# Patient Record
Sex: Male | Born: 1960 | Race: White | Hispanic: No | Marital: Single | Smoking: Never smoker
Health system: Southern US, Community
[De-identification: ages and names within clinical notes are randomized; demographics above are authoritative.]

## PROBLEM LIST (undated history)

## (undated) DIAGNOSIS — K219 Gastro-esophageal reflux disease without esophagitis: Secondary | ICD-10-CM

## (undated) DIAGNOSIS — F32A Depression, unspecified: Secondary | ICD-10-CM

## (undated) DIAGNOSIS — E559 Vitamin D deficiency, unspecified: Secondary | ICD-10-CM

## (undated) DIAGNOSIS — I10 Essential (primary) hypertension: Secondary | ICD-10-CM

## (undated) DIAGNOSIS — H269 Unspecified cataract: Secondary | ICD-10-CM

## (undated) DIAGNOSIS — M1990 Unspecified osteoarthritis, unspecified site: Secondary | ICD-10-CM

## (undated) DIAGNOSIS — F319 Bipolar disorder, unspecified: Secondary | ICD-10-CM

## (undated) DIAGNOSIS — E663 Overweight: Secondary | ICD-10-CM

## (undated) DIAGNOSIS — E039 Hypothyroidism, unspecified: Secondary | ICD-10-CM

## (undated) DIAGNOSIS — T7840XA Allergy, unspecified, initial encounter: Secondary | ICD-10-CM

## (undated) DIAGNOSIS — F329 Major depressive disorder, single episode, unspecified: Secondary | ICD-10-CM

## (undated) DIAGNOSIS — E119 Type 2 diabetes mellitus without complications: Secondary | ICD-10-CM

## (undated) DIAGNOSIS — E785 Hyperlipidemia, unspecified: Secondary | ICD-10-CM

## (undated) HISTORY — DX: Vitamin D deficiency, unspecified: E55.9

## (undated) HISTORY — DX: Bipolar disorder, unspecified: F31.9

## (undated) HISTORY — DX: Gastro-esophageal reflux disease without esophagitis: K21.9

## (undated) HISTORY — DX: Depression, unspecified: F32.A

## (undated) HISTORY — DX: Overweight: E66.3

## (undated) HISTORY — DX: Essential (primary) hypertension: I10

## (undated) HISTORY — DX: Unspecified osteoarthritis, unspecified site: M19.90

## (undated) HISTORY — DX: Type 2 diabetes mellitus without complications: E11.9

## (undated) HISTORY — DX: Hypothyroidism, unspecified: E03.9

## (undated) HISTORY — DX: Unspecified cataract: H26.9

## (undated) HISTORY — DX: Hyperlipidemia, unspecified: E78.5

## (undated) HISTORY — PX: COLONOSCOPY: SHX174

## (undated) HISTORY — DX: Allergy, unspecified, initial encounter: T78.40XA

## (undated) HISTORY — PX: POLYPECTOMY: SHX149

## (undated) HISTORY — PX: APPENDECTOMY: SHX54

---
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
## 2000-03-12 LAB — PULMONARY FUNCTION TEST

## 2001-10-25 ENCOUNTER — Emergency Department (HOSPITAL_COMMUNITY): Admission: EM | Admit: 2001-10-25 | Discharge: 2001-10-25 | Payer: Self-pay | Admitting: *Deleted

## 2004-08-10 ENCOUNTER — Ambulatory Visit: Payer: Self-pay | Admitting: Family Medicine

## 2004-10-15 ENCOUNTER — Ambulatory Visit: Payer: Self-pay | Admitting: *Deleted

## 2004-11-29 ENCOUNTER — Ambulatory Visit: Payer: Self-pay | Admitting: Family Medicine

## 2005-07-26 ENCOUNTER — Ambulatory Visit: Payer: Self-pay | Admitting: Family Medicine

## 2005-07-26 DIAGNOSIS — J309 Allergic rhinitis, unspecified: Secondary | ICD-10-CM | POA: Insufficient documentation

## 2006-10-20 ENCOUNTER — Telehealth (INDEPENDENT_AMBULATORY_CARE_PROVIDER_SITE_OTHER): Payer: Self-pay | Admitting: *Deleted

## 2006-10-22 ENCOUNTER — Telehealth (INDEPENDENT_AMBULATORY_CARE_PROVIDER_SITE_OTHER): Payer: Self-pay | Admitting: Family Medicine

## 2006-10-24 DIAGNOSIS — E039 Hypothyroidism, unspecified: Secondary | ICD-10-CM

## 2006-10-24 DIAGNOSIS — J45909 Unspecified asthma, uncomplicated: Secondary | ICD-10-CM | POA: Insufficient documentation

## 2006-10-27 ENCOUNTER — Ambulatory Visit: Payer: Self-pay | Admitting: Family Medicine

## 2006-10-29 ENCOUNTER — Encounter (INDEPENDENT_AMBULATORY_CARE_PROVIDER_SITE_OTHER): Payer: Self-pay | Admitting: *Deleted

## 2006-10-31 LAB — CONVERTED CEMR LAB
BUN: 19 mg/dL (ref 6–23)
Basophils Relative: 1 % (ref 0–1)
CO2: 25 meq/L (ref 19–32)
Calcium: 9.6 mg/dL (ref 8.4–10.5)
Chloride: 104 meq/L (ref 96–112)
Creatinine, Ser: 1.1 mg/dL (ref 0.40–1.50)
Hemoglobin: 14.8 g/dL (ref 13.0–17.0)
Lithium Lvl: 0.82 meq/L (ref 0.80–1.40)
Lymphocytes Relative: 24 % (ref 12–46)
MCHC: 33.9 g/dL (ref 30.0–36.0)
Monocytes Absolute: 0.6 10*3/uL (ref 0.2–0.7)
Monocytes Relative: 5 % (ref 3–11)
Neutro Abs: 7 10*3/uL (ref 1.7–7.7)
PSA: 0.6 ng/mL (ref 0.10–4.00)
RBC: 4.95 M/uL (ref 4.22–5.81)
TSH: 11.095 microintl units/mL — ABNORMAL HIGH (ref 0.350–5.50)

## 2007-01-28 ENCOUNTER — Telehealth (INDEPENDENT_AMBULATORY_CARE_PROVIDER_SITE_OTHER): Payer: Self-pay | Admitting: Family Medicine

## 2007-02-16 ENCOUNTER — Ambulatory Visit: Payer: Self-pay | Admitting: Family Medicine

## 2007-02-19 LAB — CONVERTED CEMR LAB: TSH: 11.015 microintl units/mL — ABNORMAL HIGH (ref 0.350–5.50)

## 2007-03-11 ENCOUNTER — Telehealth (INDEPENDENT_AMBULATORY_CARE_PROVIDER_SITE_OTHER): Payer: Self-pay | Admitting: Internal Medicine

## 2007-04-01 ENCOUNTER — Ambulatory Visit: Payer: Self-pay | Admitting: Internal Medicine

## 2008-06-21 ENCOUNTER — Telehealth (INDEPENDENT_AMBULATORY_CARE_PROVIDER_SITE_OTHER): Payer: Self-pay | Admitting: Family Medicine

## 2008-07-27 ENCOUNTER — Ambulatory Visit: Payer: Self-pay | Admitting: Family Medicine

## 2008-07-27 ENCOUNTER — Encounter (INDEPENDENT_AMBULATORY_CARE_PROVIDER_SITE_OTHER): Payer: Self-pay | Admitting: Nurse Practitioner

## 2010-01-12 ENCOUNTER — Emergency Department (HOSPITAL_COMMUNITY)
Admission: EM | Admit: 2010-01-12 | Discharge: 2010-01-12 | Payer: Self-pay | Source: Home / Self Care | Admitting: Emergency Medicine

## 2010-01-12 ENCOUNTER — Emergency Department (HOSPITAL_COMMUNITY)
Admission: EM | Admit: 2010-01-12 | Discharge: 2010-01-12 | Disposition: A | Discharge status: Other Acute Inpatient Hospital | Payer: Self-pay | Source: Home / Self Care | Admitting: Family Medicine

## 2010-05-29 ENCOUNTER — Encounter: Payer: Self-pay | Admitting: Pulmonary Disease

## 2010-05-31 ENCOUNTER — Ambulatory Visit (INDEPENDENT_AMBULATORY_CARE_PROVIDER_SITE_OTHER): Payer: Medicare Other | Admitting: Pulmonary Disease

## 2010-05-31 ENCOUNTER — Encounter: Payer: Self-pay | Admitting: Pulmonary Disease

## 2010-05-31 VITALS — BP 132/90 | HR 88 | Temp 98.3°F | Ht 72.0 in | Wt 239.8 lb

## 2010-05-31 DIAGNOSIS — E559 Vitamin D deficiency, unspecified: Secondary | ICD-10-CM

## 2010-05-31 DIAGNOSIS — J45909 Unspecified asthma, uncomplicated: Secondary | ICD-10-CM

## 2010-05-31 DIAGNOSIS — E663 Overweight: Secondary | ICD-10-CM

## 2010-05-31 DIAGNOSIS — F419 Anxiety disorder, unspecified: Secondary | ICD-10-CM

## 2010-05-31 DIAGNOSIS — E7800 Pure hypercholesterolemia, unspecified: Secondary | ICD-10-CM

## 2010-05-31 DIAGNOSIS — E039 Hypothyroidism, unspecified: Secondary | ICD-10-CM

## 2010-05-31 DIAGNOSIS — I10 Essential (primary) hypertension: Secondary | ICD-10-CM

## 2010-05-31 DIAGNOSIS — F319 Bipolar disorder, unspecified: Secondary | ICD-10-CM

## 2010-05-31 DIAGNOSIS — M1990 Unspecified osteoarthritis, unspecified site: Secondary | ICD-10-CM

## 2010-05-31 DIAGNOSIS — F411 Generalized anxiety disorder: Secondary | ICD-10-CM

## 2010-05-31 MED ORDER — ERGOCALCIFEROL 1.25 MG (50000 UT) PO CAPS: 50000.0000 [IU] | ORAL_CAPSULE | ORAL | Status: DC

## 2010-05-31 MED ORDER — LISINOPRIL-HYDROCHLOROTHIAZIDE 10-12.5 MG PO TABS: 1.0000 | ORAL_TABLET | Freq: Every day | ORAL | Status: DC

## 2010-05-31 MED ORDER — SIMVASTATIN 40 MG PO TABS: 40.0000 mg | ORAL_TABLET | Freq: Every day | ORAL | Status: DC

## 2010-05-31 MED ORDER — MOMETASONE FUROATE 220 MCG/INH IN AEPB: 2.0000 | INHALATION_SPRAY | Freq: Every day | RESPIRATORY_TRACT | Status: DC

## 2010-05-31 MED ORDER — LEVOTHYROXINE SODIUM 200 MCG PO TABS: 200.0000 ug | ORAL_TABLET | Freq: Every day | ORAL | Status: DC

## 2010-05-31 MED ORDER — ALBUTEROL SULFATE HFA 108 (90 BASE) MCG/ACT IN AERS: 2.0000 | INHALATION_SPRAY | Freq: Four times a day (QID) | RESPIRATORY_TRACT | Status: DC | PRN

## 2010-05-31 NOTE — Patient Instructions (Signed)
Mark Booth, it was nice meeting you today...    We entered your medications into our system & wrote new prescriptions as we discussed...  For your Asthma:  Take the Asmanex one inhalation daily as a controller med, and use the Proair 1-2sp every 4-6H as needed (rescue inhaler); when you return we will inquire about how your control has been on this regimen & go from there...  Continue the BP med & Simvastatin for your Cholesterol... Continue the current dose of the Synthroid for now...  Please return to our lab one morning next week for your fasting blood work...    Then call the PHONE TREE in a few days for your results...    Dial [id number] & when prompted enter your patient number followed by the # symbol...    Your patient number is:  [id number]#  Call for any questions... Let's plan a follow up appt in one month.Marland KitchenMarland Kitchen

## 2010-06-02 ENCOUNTER — Encounter: Payer: Self-pay | Admitting: Pulmonary Disease

## 2010-06-02 DIAGNOSIS — E559 Vitamin D deficiency, unspecified: Secondary | ICD-10-CM | POA: Insufficient documentation

## 2010-06-02 DIAGNOSIS — F319 Bipolar disorder, unspecified: Secondary | ICD-10-CM | POA: Insufficient documentation

## 2010-06-02 DIAGNOSIS — M1990 Unspecified osteoarthritis, unspecified site: Secondary | ICD-10-CM | POA: Insufficient documentation

## 2010-06-02 DIAGNOSIS — E669 Obesity, unspecified: Secondary | ICD-10-CM | POA: Insufficient documentation

## 2010-06-02 NOTE — Progress Notes (Signed)
Subjective:    Patient ID: Mark Booth, male    DOB: December 07, 1960, 50 y.o.   MRN: [id number]  HPI 49 y/o WM, son of Jakell Trusty, here to establish medical care> he is disabled due to Bipolar disease & unemployed;  He was prev followed by [facility] & their EMR records are reviewed;  Most recently followed by DrDewey at the [facility] ([facility] of [facility]) & we have requested these records as well...  PROBLEM LIST:  Allergic Rhinitis>  He AR & allergies w/ mod symptoms in spring & fall that he treats w/ OTC meds prn;  Prev allergy testing pos for grasses & "lots of things" he says;  He was on shots for several yrs & had hx elev eos he thinks...  Asthma>  He states hx Asthma x yrs w/ symptoms primarily during exercise- chest tightness, wheezing, cough, SOB, etc;  Treated w/ Advair- switched to ASMANEX 220- 1 inhalation daily & PROAIR as rescue inhaler (hasn't needed in many months, he says)... ~  ?prev CXR, ?prev PFTs> we will wait for old records ~  12/11:  CXR from [facility] visit for foreing body in esoph (?chicken bone)> mild perihilar bronchitic change, norm heart size, NAD... ~  4/12:  Pt w/o recent asthma exac> no wheezing, no cough, no dyspnea reported;  Exam- clear...  HBP>  On LISINOPRIL/HCT 10-12.5 daily w/ fair control of BP;  Asked to monitor BP at home if poss, & we discussed low sodium/ wt reducing diet... ~  4/12:  BP= 132/90 & he denies CP, palpit, dizzy, syncope, edema, etc...  Hyperlipidemia>  On SIMVASTATIN 40mg /d which he reports was started by [facility] ~[id number];  We discussed low chol/ low fat/ wt reducing diet... ~  4/12:  Pt will ret for FLP>   Hypothyroidism>  On SYNTHROID 218mcg/d (on this dose for about 38yr he says);  Long hx hypothy dating back to his teens... ~  6/10:  Avail records from [facility] showed Rx w/ Levothy 122mcg/d (refilled) & TSH= 10.75, ?compliance, prev= 3.62)...  Osteoarthritis>  On OTC anti-inflamm meds prn;  C/o left knee pain off & on "it's  my MCL" but he has apparently not had an Ortho eval... ~  Hx LBP w/ herniated disc in 2000, he says;  He notes abn MRI in [place], but no surg required (he stopped running & symptoms improved)...  Vit D Defic>  On Vit D 50,000 u weekly since Jan2012, he says;  This prob was discovered by Psychiatrist & confirmed by his LMD...  Biploar Disorder>  On LITHIUM 300mg - 4 daily, and LAMICTAL 200mg  daily; followed by Dr. Cherre Robins for Psychiatry...   Past Medical History  Diagnosis Date  . Allergic rhinitis   . Bipolar disorder   . Hypothyroidism   . Asthma   . Hypertension   . Hyperlipidemia   . Overweight   . Osteoarthritis   . Unspecified vitamin D deficiency     Past Surgical History  Procedure Date  . Appendectomy     Outpatient Encounter Prescriptions as of 05/31/2010  Medication Sig Dispense Refill  . albuterol (PROAIR HFA) 108 (90 BASE) MCG/ACT inhaler Inhale 2 puffs into the lungs every 6 (six) hours as needed.  1 Inhaler  6  . ergocalciferol (VITAMIN D2) 50000 UNITS capsule Take 1 capsule (50,000 Units total) by mouth once a week.  4 capsule  6  . lamoTRIgine (LAMICTAL) 100 MG tablet 2 tablets once a day       . levothyroxine (  SYNTHROID, LEVOTHROID) 200 MCG tablet Take 1 tablet (200 mcg total) by mouth daily.  30 tablet  6  . lithium 300 MG capsule Take 1200mg  daily      . mometasone (ASMANEX 120 METERED DOSES) 220 MCG/INH inhaler Inhale 2 puffs into the lungs daily.  1 Inhaler  6  . simvastatin (ZOCOR) 40 MG tablet Take 1 tablet (40 mg total) by mouth at bedtime.  30 tablet  6  . Fluticasone-Salmeterol (ADVAIR DISKUS) 250-50 MCG/DOSE AEPB Inhale 1 puff into the lungs every 12 (twelve) hours.        Marland Kitchen lisinopril-hydrochlorothiazide (PRINZIDE,ZESTORETIC) 10-12.5 MG per tablet Take 1 tablet by mouth daily.  30 tablet  6  . sertraline (ZOLOFT) 50 MG tablet Take 50 mg by mouth every morning.          No Known Allergies   Family History  Problem Relation Age of Onset  .  Osteoporosis Mother   . Depression Mother   . Diabetes Mother   . Hypothyroidism Mother   . Prostate cancer Father     History   Social History  . Marital Status: Single    Spouse Name: N/A    Number of Children: N/A  . Years of Education: N/A   Occupational History  . Unemployed    Social History Main Topics  . Smoking status: Never Smoker   . Smokeless tobacco: Never Used  . Alcohol Use: No     stopped using etoh in 2006  . Drug Use: No  . Sexually Active: Not on file   Other Topics Concern  . Not on file   Social History Narrative  . No narrative on file    Review of Systems    Constitutional:  Denies F/C/S, anorexia, unexpected weight change. HEENT:  No HA, visual changes, earache, nasal symptoms, sore throat, hoarseness. Resp:  No cough, sputum, hemoptysis; no SOB, tightness, wheezing. Cardio:  No CP, palpit, DOE, orthopnea, edema. GI:  Denies N/V/D/C or blood in stool; no reflux, abd pain, distention, or gas. GU:  No dysuria, freq, urgency, hematuria, or flank pain. MS:  Denies joint pain, swelling, tenderness, or decr ROM; no neck pain, back pain, etc. Neuro:  No tremors, seizures, dizziness, syncope, weakness, numbness, gait abn. Skin:  No suspicious lesions or skin rash. Heme:  No adenopathy, bruising, bleeding. Psyche: Hx Bipolar dis; denies confusion, sleep disturbance, hallucinations.   Objective:   Physical Exam    WD, Overweight, 50 y/o WM in NAD... Vital Signs:  Reviewed...  General:  Alert & oriented; pleasant & cooperative... HEENT:  [place]/AT, EOM-wnl, PERRLA, Fundi-benign, EACs-clear, TMs-wnl, NOSE-clear, THROAT-clear & wnl. Neck:  Supple w/ full ROM; no JVD; normal carotid impulses w/o bruits; no thyromegaly or nodules palpated; no lymphadenopathy. Chest:  Clear to P & A; without wheezes/ rales/ or rhonchi heard;  Pulses intact w/o bruits... Heart:  Regular Rhythm; norm S1 & S2 without murmurs/ rubs/ or gallops detected... Abdomen:  Soft &  nontender; normal bowel sounds; no organomegaly or masses palpated... Ext:  Normal ROM; without deformities or arthritic changes; no varicose veins, venous insuffic, or edema... Neuro:  CNs II-XII intact; motor testing normal; sensory testing normal; gait normal & balance OK... Derm:   Lipoma noted on left shoulder;  no rash etc... Lymph:  No cervical, supraclavicular, axillary, or inguinal adenopathy palpated...    Assessment & Plan:   NEW PATIENT EVAL>  We have reviewed avail records in [facility];  We have requested records from his  most recent LMD- DrDewey at the [facility]...  AR & Asthma>  Stable on his most recent Rx w/ ASMANEX 220- 1inhalation daily, & Proair prn;  We will await records & review any prev PFTs- sounds clear now & last CXR 12/11 was neg;  Continue current meds for now & watch for exercise induced symptoms & monitor use of rescue inhaler...  HBP>  Control looks fair in the Prinizide, asked to elim sodium/ get wt down/ continue same med for now...  CHOL>  We discussed low chol/ low fat/ wt reducing diet;  Ret for FLP...  Overweight>  We reviewed diet & exercise program needed to lose wt...  Hypothyroid>  ?why requiring 242mcg/d dose?  prob compliance related in my opinion & we will recheck labs & monitor med use, asked to bring bottles to each visit...  Vit D Defic>  Awaiting records from [facility];  Continue 50K per week for now & we will f/u labs later...  Bipolar dis>  He will maintain f/u w/ DrDuskin, Psychiatry & try to get records to [place]...  Lipoma>  Mod sized lipoma left shoulder & we can refer to [facility] when he is ready.Marland KitchenMarland Kitchen

## 2010-06-06 ENCOUNTER — Other Ambulatory Visit (INDEPENDENT_AMBULATORY_CARE_PROVIDER_SITE_OTHER): Payer: Medicare Other

## 2010-06-06 ENCOUNTER — Other Ambulatory Visit (INDEPENDENT_AMBULATORY_CARE_PROVIDER_SITE_OTHER): Payer: Medicare Other | Admitting: Pulmonary Disease

## 2010-06-06 DIAGNOSIS — F419 Anxiety disorder, unspecified: Secondary | ICD-10-CM

## 2010-06-06 DIAGNOSIS — E7800 Pure hypercholesterolemia, unspecified: Secondary | ICD-10-CM

## 2010-06-06 DIAGNOSIS — I10 Essential (primary) hypertension: Secondary | ICD-10-CM

## 2010-06-06 DIAGNOSIS — E785 Hyperlipidemia, unspecified: Secondary | ICD-10-CM

## 2010-06-06 DIAGNOSIS — E039 Hypothyroidism, unspecified: Secondary | ICD-10-CM

## 2010-06-06 DIAGNOSIS — F411 Generalized anxiety disorder: Secondary | ICD-10-CM

## 2010-06-06 LAB — CBC WITH DIFFERENTIAL/PLATELET
Basophils Absolute: 0.1 10*3/uL (ref 0.0–0.1)
Eosinophils Absolute: 0.7 10*3/uL (ref 0.0–0.7)
Hemoglobin: 14.8 g/dL (ref 13.0–17.0)
Lymphocytes Relative: 24.6 % (ref 12.0–46.0)
MCHC: 34.7 g/dL (ref 30.0–36.0)
MCV: 90.4 fl (ref 78.0–100.0)
Monocytes Absolute: 0.6 10*3/uL (ref 0.1–1.0)
Neutro Abs: 7.7 10*3/uL (ref 1.4–7.7)
RDW: 12.8 % (ref 11.5–14.6)

## 2010-06-06 LAB — HEPATIC FUNCTION PANEL
ALT: 28 U/L (ref 0–53)
Alkaline Phosphatase: 71 U/L (ref 39–117)
Bilirubin, Direct: 0.1 mg/dL (ref 0.0–0.3)
Total Protein: 7.2 g/dL (ref 6.0–8.3)

## 2010-06-06 LAB — BASIC METABOLIC PANEL
CO2: 28 mEq/L (ref 19–32)
Calcium: 9.5 mg/dL (ref 8.4–10.5)
Chloride: 104 mEq/L (ref 96–112)
Sodium: 140 mEq/L (ref 135–145)

## 2010-06-06 LAB — LIPID PANEL: Total CHOL/HDL Ratio: 5

## 2010-06-06 LAB — T4, FREE: Free T4: 0.79 ng/dL (ref 0.60–1.60)

## 2010-06-13 ENCOUNTER — Telehealth: Payer: Self-pay | Admitting: Pulmonary Disease

## 2010-06-13 MED ORDER — FENOFIBRATE 160 MG PO TABS: 160.0000 mg | ORAL_TABLET | Freq: Every day | ORAL | Status: DC

## 2010-06-13 NOTE — Telephone Encounter (Signed)
Called and spoke with pt and he stated that he did listen to the phone tree message and is aware to start on the fenofibrate 160mg  daily.  This has been sent to pts pharmacy and pt is aware.  He will cont to do better diet--and is to call for any problems or concerns.

## 2010-06-13 NOTE — Telephone Encounter (Signed)
Per Leigh forward to her.

## 2010-06-27 ENCOUNTER — Encounter: Payer: Self-pay | Admitting: Pulmonary Disease

## 2010-07-02 ENCOUNTER — Telehealth: Payer: Self-pay | Admitting: Pulmonary Disease

## 2010-07-02 NOTE — Telephone Encounter (Signed)
Forwarded to Dr. Nadel for review. °

## 2010-07-04 ENCOUNTER — Ambulatory Visit: Payer: Medicare Other | Admitting: Pulmonary Disease

## 2010-07-06 ENCOUNTER — Telehealth: Payer: Self-pay | Admitting: Pulmonary Disease

## 2010-07-06 NOTE — Telephone Encounter (Signed)
Forwarded to Dr. Nadel for review. °

## 2010-07-23 ENCOUNTER — Encounter: Payer: Self-pay | Admitting: Pulmonary Disease

## 2010-07-25 ENCOUNTER — Encounter: Payer: Self-pay | Admitting: Pulmonary Disease

## 2010-07-25 ENCOUNTER — Ambulatory Visit (INDEPENDENT_AMBULATORY_CARE_PROVIDER_SITE_OTHER): Payer: Medicare Other | Admitting: Pulmonary Disease

## 2010-07-25 DIAGNOSIS — F419 Anxiety disorder, unspecified: Secondary | ICD-10-CM

## 2010-07-25 DIAGNOSIS — I10 Essential (primary) hypertension: Secondary | ICD-10-CM

## 2010-07-25 DIAGNOSIS — E039 Hypothyroidism, unspecified: Secondary | ICD-10-CM

## 2010-07-25 DIAGNOSIS — M1990 Unspecified osteoarthritis, unspecified site: Secondary | ICD-10-CM

## 2010-07-25 DIAGNOSIS — E559 Vitamin D deficiency, unspecified: Secondary | ICD-10-CM

## 2010-07-25 DIAGNOSIS — F319 Bipolar disorder, unspecified: Secondary | ICD-10-CM

## 2010-07-25 DIAGNOSIS — E1169 Type 2 diabetes mellitus with other specified complication: Secondary | ICD-10-CM | POA: Insufficient documentation

## 2010-07-25 DIAGNOSIS — J309 Allergic rhinitis, unspecified: Secondary | ICD-10-CM

## 2010-07-25 DIAGNOSIS — E782 Mixed hyperlipidemia: Secondary | ICD-10-CM

## 2010-07-25 DIAGNOSIS — J45909 Unspecified asthma, uncomplicated: Secondary | ICD-10-CM

## 2010-07-25 NOTE — Patient Instructions (Signed)
Today we updated your med list in our EPIC system...    Continue your present meds the same...  Let's get on the low chol/ LOW FAT/ diet & work on weight reduction...  Call for any problems...  Let's plan a recheck in 3-4 months w/ FASTING blood work at that time.Marland KitchenMarland Kitchen

## 2010-07-25 NOTE — Progress Notes (Signed)
Subjective:    Patient ID: Mark Booth, male    DOB: 08/03/1960, 50 y.o.   MRN: [id number]  HPI 59 y/o WM, son of Keyshaun Exley, here to establish medical care> he is disabled due to Bipolar disease & unemployed;  He was prev followed by [facility] & their EMR records are reviewed;  Most recently followed by DrDewey at the [facility] (part of [facility]) & we have requested these records as well ==> reviewed, see below...  PROBLEM LIST:  Allergic Rhinitis>  He has AR & allergies w/ mod symptoms in spring & fall that he treats w/ OTC meds prn;  Prev allergy testing pos for grasses & "lots of things" he says;  He was on shots for several yrs & had hx elev eos he thinks...  Asthma>  He states hx Asthma x yrs w/ symptoms primarily during exercise- chest tightness, wheezing, cough, SOB, etc;  Treated w/ Advair- switched to ASMANEX 220- 1 inhalation daily & PROAIR as rescue inhaler (hasn't needed in many months, he says)... ~  ?prev CXR (none sent from prev MD), ?prev PFTs (1/12 showed FEV1=3.38 [76%] & %1sec=76) ~  12/11:  CXR from [facility] visit for foreing body in esoph (?chicken bone)> mild perihilar bronchitic change, norm heart size, NAD... ~  4/12 & 6/12:  Pt w/o recent asthma exac> no wheezing, no cough, no dyspnea reported;  Exam- clear...  HBP>  On LISINOPRIL/HCT 10-12.5 daily w/ fair control of BP;  Asked to monitor BP at home if poss, & we discussed low sodium/ wt reducing diet... ~  4/12:  BP= 132/90 & he denies CP, palpit, dizzy, syncope, edema, etc... ~  6/12:  BP= 140/90 & reminded to limit sodium, incr exercise & lose wt...  Hyperlipidemia>  On SIMVASTATIN 40mg /d which he reports was started by [facility] ~[id number];  We discussed low chol/ low fat/ wt reducing diet... ~  1/12:  Records from [facility] showed TChol 252 (prev=317), TG 548, HDL 37, LDL --- & he started SIMVASTATIN 40mg /d + low fat diet. ~  4/12:  FLP on Simva40 showed TChol 217, TG 417, HDL 41, LDL 118... rec low fat  diet & start FENOFIBRATE 160mg /d...  Hypothyroidism>  On SYNTHROID 277mcg/d (on this dose for about 22yr he says);  Long hx hypothy dating back to his teens... ~  6/10:  Avail records from [facility] showed Rx w/ Levothy 113mcg/d (refilled) & TSH= 10.75, ?compliance, prev= 3.62)... ~  1/12:  Labs from DrDewey's office showed TSH= 2.89 on Levothy200 daily. ~  Labs here 4/12 on Levothy200 showed TSH= 3.84, FreeT3= 2.6 (2.3-4.2), FreeT4= 0.79 (0.60-1.60)... Continue same dose.  Osteoarthritis>  On OTC anti-inflamm meds prn;  C/o left knee pain off & on "it's my MCL" but he has apparently not had an Ortho eval... ~  Hx LBP w/ herniated disc in 2000, he says;  He notes abn MRI in [place], but no surg required (he stopped running & symptoms improved)...  Vit D Defic>  On Vit D 50,000 u weekly since Jan2012, he says;  This prob was discovered by Psychiatrist & confirmed by his LMD... ~  Labs 1/12 from [facility] showed Vit D = 11... Pt to continue 50K Vit D weekly...  Biploar Disorder>  On LITHIUM 300mg - 3 daily, and LAMICTAL 200mg  daily... Prev on Zoloft & wellbutrin as well (off now). ~  4/12:  followed by Dr. Cherre Robins for Psychiatry at the [facility] center... ~  6/12:  Pt informs me that DrDuskin  has left the center (now contracted to a pvt company) & he sees diff doctors.   Past Medical History  Diagnosis Date  . Allergic rhinitis   . Bipolar disorder   . Hypothyroidism   . Asthma   . Hypertension   . Hyperlipidemia   . Overweight   . Osteoarthritis   . Unspecified vitamin D deficiency     Past Surgical History  Procedure Date  . Appendectomy     Outpatient Encounter Prescriptions as of 07/25/2010  Medication Sig Dispense Refill  . albuterol (PROAIR HFA) 108 (90 BASE) MCG/ACT inhaler Inhale 2 puffs into the lungs every 6 (six) hours as needed.  1 Inhaler  6  . ergocalciferol (VITAMIN D2) 50000 UNITS capsule Take 1 capsule (50,000 Units total) by mouth once a week.  4  capsule  6  . fenofibrate 160 MG tablet Take 1 tablet (160 mg total) by mouth daily.  90 tablet  3  . lamoTRIgine (LAMICTAL) 100 MG tablet 2 tablets once a day       . levothyroxine (SYNTHROID, LEVOTHROID) 200 MCG tablet Take 1 tablet (200 mcg total) by mouth daily.  30 tablet  6  . lithium 300 MG capsule Take 3 tabs = 900mg  daily      . simvastatin (ZOCOR) 40 MG tablet Take 1 tablet (40 mg total) by mouth at bedtime.  30 tablet  6  . lisinopril-hydrochlorothiazide (PRINZIDE,ZESTORETIC) 10-12.5 MG per tablet Take 1 tablet by mouth daily.  30 tablet  6  . mometasone (ASMANEX 120 METERED DOSES) 220 MCG/INH inhaler Inhale 2 puffs into the lungs daily.  1 Inhaler  6    No Known Allergies   Family History  Problem Relation Age of Onset  . Osteoporosis Mother   . Depression Mother   . Diabetes Mother   . Hypothyroidism Mother   . Prostate cancer Father     History   Social History  . Marital Status: Single    Spouse Name: N/A    Number of Children: N/A  . Years of Education: N/A   Occupational History  . Unemployed    Social History Main Topics  . Smoking status: Never Smoker   . Smokeless tobacco: Never Used  . Alcohol Use: No     stopped using etoh in 2006  . Drug Use: No  . Sexually Active: Not on file   Other Topics Concern  . Not on file   Social History Narrative  . No narrative on file    Review of Systems    Constitutional:  Denies F/C/S, anorexia, unexpected weight change. HEENT:  No HA, visual changes, earache, nasal symptoms, sore throat, hoarseness. Resp:  No cough, sputum, hemoptysis; no SOB, tightness, wheezing. Cardio:  No CP, palpit, DOE, orthopnea, edema. GI:  Denies N/V/D/C or blood in stool; no reflux, abd pain, distention, or gas. GU:  No dysuria, freq, urgency, hematuria, or flank pain. MS:  Denies joint pain, swelling, tenderness, or decr ROM; no neck pain, back pain, etc. Neuro:  No tremors, seizures, dizziness, syncope, weakness, numbness,  gait abn. Skin:  No suspicious lesions or skin rash. Heme:  No adenopathy, bruising, bleeding. Psyche: Hx Bipolar dis; denies confusion, sleep disturbance, hallucinations.   Objective:   Physical Exam    WD, Overweight, 50 y/o WM in NAD... Vital Signs:  Reviewed...  General:  Alert & oriented; pleasant & cooperative... HEENT:  [place]/AT, EOM-wnl, PERRLA, Fundi-benign, EACs-clear, TMs-wnl, NOSE-clear, THROAT-clear & wnl. Neck:  Supple w/ full ROM;  no JVD; normal carotid impulses w/o bruits; no thyromegaly or nodules palpated; no lymphadenopathy. Chest:  Clear to P & A; without wheezes/ rales/ or rhonchi heard;  Pulses intact w/o bruits... Heart:  Regular Rhythm; norm S1 & S2 without murmurs/ rubs/ or gallops detected... Abdomen:  Soft & nontender; normal bowel sounds; no organomegaly or masses palpated... Ext:  Normal ROM; without deformities or arthritic changes; no varicose veins, venous insuffic, or edema... Neuro:  CNs II-XII intact; motor testing normal; sensory testing normal; gait normal & balance OK... Derm:   Lipoma noted on left shoulder;  no rash etc... Lymph:  No cervical, supraclavicular, axillary, or inguinal adenopathy palpated...    Assessment & Plan:   AR & Asthma>  Stable on his most recent Rx w/ ASMANEX 220- 1inhalation daily, & Proair prn;  records reviewed> PFTs w/ mild sm airways dis & ?superimposed restriction... sounds clear now & last CXR 12/11 was neg;  Continue current meds for now & watch for exercise induced symptoms & monitor use of rescue inhaler...  HBP>  Control looks fair in the Prinizide, asked to elim sodium/ get wt down/ continue same med for now...  CHOL>  We discussed low chol/ low fat/ wt reducing diet;  Continue Simva40 + Feno160...  Overweight>  We reviewed diet & exercise program needed to lose wt...  Hypothyroid>  ?why requiring 263mcg/d dose?  prob compliance related in my opinion & we will recheck labs & monitor med use, asked to bring bottles  to each visit...  Vit D Defic>  Vit D was very low 1/12 ==> continue 50K per week for now & we will f/u labs later...  Bipolar dis>  He will maintain f/u w/ Psychiatry & try to get records to [place]...  Lipoma>  Mod sized lipoma left shoulder & we can refer to [facility] when he is ready.Marland KitchenMarland Kitchen

## 2010-08-21 ENCOUNTER — Other Ambulatory Visit: Payer: Self-pay | Admitting: Pulmonary Disease

## 2010-11-19 ENCOUNTER — Ambulatory Visit: Payer: Medicare Other | Admitting: Pulmonary Disease

## 2010-12-03 ENCOUNTER — Ambulatory Visit (INDEPENDENT_AMBULATORY_CARE_PROVIDER_SITE_OTHER): Payer: Medicare Other | Admitting: Pulmonary Disease

## 2010-12-03 ENCOUNTER — Encounter: Payer: Self-pay | Admitting: Pulmonary Disease

## 2010-12-03 DIAGNOSIS — E782 Mixed hyperlipidemia: Secondary | ICD-10-CM

## 2010-12-03 DIAGNOSIS — J309 Allergic rhinitis, unspecified: Secondary | ICD-10-CM

## 2010-12-03 DIAGNOSIS — F319 Bipolar disorder, unspecified: Secondary | ICD-10-CM

## 2010-12-03 DIAGNOSIS — E559 Vitamin D deficiency, unspecified: Secondary | ICD-10-CM

## 2010-12-03 DIAGNOSIS — M1990 Unspecified osteoarthritis, unspecified site: Secondary | ICD-10-CM

## 2010-12-03 DIAGNOSIS — I10 Essential (primary) hypertension: Secondary | ICD-10-CM

## 2010-12-03 DIAGNOSIS — E039 Hypothyroidism, unspecified: Secondary | ICD-10-CM

## 2010-12-03 DIAGNOSIS — J45909 Unspecified asthma, uncomplicated: Secondary | ICD-10-CM

## 2010-12-03 MED ORDER — LISINOPRIL-HYDROCHLOROTHIAZIDE 10-12.5 MG PO TABS: 1.0000 | ORAL_TABLET | Freq: Every day | ORAL | Status: DC

## 2010-12-03 MED ORDER — ERGOCALCIFEROL 1.25 MG (50000 UT) PO CAPS: 50000.0000 [IU] | ORAL_CAPSULE | ORAL | Status: DC

## 2010-12-03 NOTE — Progress Notes (Signed)
Subjective:    Patient ID: Mark Booth, male    DOB: [birth year], 50 y.o.   MRN: [id number]  HPI 25 y/o WM, son of Xerxes Agrusa, here to establish medical care> he is disabled due to Bipolar disease & unemployed;  He was prev followed by [facility] & their EMR records are reviewed;  Most recently followed by DrDewey at the [facility] ([facility] of [facility]) & we have requested these records as well ==> reviewed, see below...  ~  December 03, 2010:  52mo ROV & he reports stable- just despondent over not having a job; lives w/ mother & there is some friction; on disability & gets $811/mo & no other source of income... He declines flu vaccine, not fasting today...    Asthma/ allergies> symptoms are worse w/ the wet weather; on QVAR80 & Proair which he uses irregularly; no recent resp exac noted...    HBP> he has been off his Lisinopril/HCT 10-12.5; BP= 140/90 & we [facility] [address] to refill & check; denies HA, CP, palpit, SOB, edema...    Hyperlipid> on Simva40 + Fenofibrate160; wt stable at 232# & we reviewed low carb low fat diet; not fasting today & we decided to wait til 2013 check for FLP.    Hypothyroid> on Levothyroid273mcg/d; numerous labs checked on this dose & OK; he remains clinically & biochem euthyroid (recall pt is on Lithium as well).    DJD> he uses OTC anti-inflamm meds as needed...    Vit D defic> he has not been taking his VitD 50K weekly med; VitD level 1/12 was low at 11 & pt rec to restart this med...    Bipolar disorder> on Lithium & Lamictal thru [facility]; he is not happy w/ the situation but accepting...          PROBLEM LIST:  Allergic Rhinitis>  He has AR & allergies w/ mod symptoms in spring & fall that he treats w/ OTC meds prn;  Prev allergy testing pos for grasses & "lots of things" he says;  He was on shots for several yrs & had hx elev eos he thinks...  Asthma>  He states hx Asthma x yrs w/ symptoms primarily during exercise-  chest tightness, wheezing, cough, SOB, etc;  Treated w/ QVAR80-2spBid (uses intermittently) & PROAIR rescue as needed... ~  ?prev CXR (none sent from prev MD), ?prev PFTs (1/12 showed FEV1=3.38 [76%] & %1sec=76) ~  12/11:  CXR from [facility] visit for foreing body in esoph (?chicken bone)> mild perihilar bronchitic change, norm heart size, NAD... ~  4/12 & 6/12:  Pt w/o recent asthma exac> no wheezing, no cough, no dyspnea reported;  Exam- clear... ~  10/12:  He remains clear, norm exam, yet he describes need for inhalers every few days due to wheezing he says...  HBP>  He is supposed to be on LISINOPRIL/HCT 10-12.5;  Asked to monitor BP at home if poss, & we discussed low sodium/ wt reducing diet... ~  4/12:  BP= 132/90 & he denies CP, palpit, dizzy, syncope, edema, etc... ~  6/12:  BP= 140/90 & reminded to limit sodium, incr exercise & lose wt... ~  10/12:  BP= 140/90 & we contacted his Pharm & called in Rx...  Hyperlipidemia>  On SIMVASTATIN 40mg /d which he reports was started by [facility] ~[id number];  We discussed low chol/ low fat/ wt reducing diet & added FENOFIBRATE 160mg /d 4/12... ~  1/12:  Records from [facility] showed TChol 252 (prev=317), TG 548,  HDL 37, LDL --- & they started SIMVASTATIN 40mg /d + low fat diet. ~  4/12:  FLP on Simva40 showed TChol 217, TG 417, HDL 41, LDL 118... rec low fat diet & start FENOFIBRATE 160mg /d...  Hypothyroidism>  On SYNTHROID 269mcg/d (on this dose for about 61yr he says);  Long hx hypothy dating back to his teens... ~  6/10:  Avail records from [facility] showed Rx w/ Levothy 134mcg/d (refilled) & TSH= 10.75, ?compliance, prev= 3.62)... ~  1/12:  Labs from DrDewey's office showed TSH= 2.89 on Levothy200 daily. ~  Labs here 4/12 on Levothy200 showed TSH= 3.84, FreeT3= 2.6 (2.3-4.2), FreeT4= 0.79 (0.60-1.60)... Continue same dose.  Osteoarthritis>  On OTC anti-inflamm meds prn;  C/o left knee pain off & on "it's my MCL" but he has apparently not had an Ortho  eval... ~  Hx LBP w/ herniated disc in 2000, he says;  He notes abn MRI in [place], but no surg required (he stopped running & symptoms improved)...  Vit D Defic>  On Vit D 50,000 u weekly since Jan2012, he says;  This prob was discovered by Psychiatrist & confirmed by his LMD... ~  Labs 1/12 from [facility] showed Vit D = 11... Pt to continue 50K Vit D weekly & we refilled this med.  Biploar Disorder>  On LITHIUM 300mg - 3 daily, and LAMICTAL 200mg  daily... Prev on Zoloft & wellbutrin as well (off now). ~  4/12:  followed by Dr. Cherre Robins for Psychiatry at the [facility] center... ~  6/12:  Pt informs me that DrDuskin has left the center (now contracted to a pvt company) & he sees diff doctors.   Past Surgical History  Procedure Date  . Appendectomy     Outpatient Encounter Prescriptions as of 12/03/2010  Medication Sig Dispense Refill  . albuterol (PROAIR HFA) 108 (90 BASE) MCG/ACT inhaler Inhale 2 puffs into the lungs every 6 (six) hours as needed.  1 Inhaler  6  . ergocalciferol (VITAMIN D2) 50000 UNITS capsule Take 1 capsule (50,000 Units total) by mouth once a week.  4 capsule  11  . fenofibrate 160 MG tablet Take 1 tablet (160 mg total) by mouth daily.  90 tablet  3  . lamoTRIgine (LAMICTAL) 100 MG tablet 2 tablets once a day       . levothyroxine (SYNTHROID, LEVOTHROID) 200 MCG tablet Take 1 tablet (200 mcg total) by mouth daily.  30 tablet  6  . lisinopril-hydrochlorothiazide (PRINZIDE,ZESTORETIC) 10-12.5 MG per tablet Take 1 tablet by mouth daily.  30 tablet  11  . lithium 300 MG capsule Take 1200mg  daily      . mometasone (ASMANEX 120 METERED DOSES) 220 MCG/INH inhaler Inhale 2 puffs into the lungs daily.  1 Inhaler  6  . simvastatin (ZOCOR) 40 MG tablet TAKE 1 TABLET AT BEDTIME  30 tablet  3  . DISCONTD: Fluticasone-Salmeterol (ADVAIR DISKUS) 250-50 MCG/DOSE AEPB Inhale 1 puff into the lungs every 12 (twelve) hours.        Marland Kitchen DISCONTD: sertraline (ZOLOFT) 50 MG  tablet Take 50 mg by mouth every morning.          No Known Allergies   Current Medications, Allergies, Past Medical History, Past Surgical History, Family History, and Social History were reviewed in [employer] record.    Review of Systems    Constitutional:  Denies F/C/S, anorexia, unexpected weight change. HEENT:  No HA, visual changes, earache, nasal symptoms, sore throat, hoarseness. Resp:  No cough,  sputum, hemoptysis; no SOB, tightness, wheezing. Cardio:  No CP, palpit, DOE, orthopnea, edema. GI:  Denies N/V/D/C or blood in stool; no reflux, abd pain, distention, or gas. GU:  No dysuria, freq, urgency, hematuria, or flank pain. MS:  Denies joint pain, swelling, tenderness, or decr ROM; no neck pain, back pain, etc. Neuro:  No tremors, seizures, dizziness, syncope, weakness, numbness, gait abn. Skin:  No suspicious lesions or skin rash. Heme:  No adenopathy, bruising, bleeding. Psyche: Hx Bipolar dis; denies confusion, sleep disturbance, hallucinations.   Objective:   Physical Exam    WD, Overweight, 50 y/o WM in NAD... Vital Signs:  Reviewed...  General:  Alert & oriented; pleasant & cooperative... HEENT:  [place]/AT, EOM-wnl, PERRLA, Fundi-benign, EACs-clear, TMs-wnl, NOSE-clear, THROAT-clear & wnl. Neck:  Supple w/ full ROM; no JVD; normal carotid impulses w/o bruits; no thyromegaly or nodules palpated; no lymphadenopathy. Chest:  Clear to P & A; without wheezes/ rales/ or rhonchi heard;  Pulses intact w/o bruits... Heart:  Regular Rhythm; norm S1 & S2 without murmurs/ rubs/ or gallops detected... Abdomen:  Soft & nontender; normal bowel sounds; no organomegaly or masses palpated... Ext:  Normal ROM; without deformities or arthritic changes; no varicose veins, venous insuffic, or edema... Neuro:  CNs II-XII intact; motor testing normal; sensory testing normal; gait normal & balance OK... Derm:   Lipoma noted on left shoulder;  no rash etc... Lymph:   No cervical, supraclavicular, axillary, or inguinal adenopathy palpated... Psyche:  He is very intelligent, sl depressed mood, oriented & coop...  DATA:  We reviewed EMR labs from 4/12; & CXR from 12/11...   Assessment & Plan:   AR & Asthma>  Stable on his most recent Rx w/ QVAR80 & Proair prn;  records reviewed> PFTs w/ mild sm airways dis & ?superimposed restriction... sounds clear now & last CXR 12/11 was neg;  Continue current meds for now & watch for exercise induced symptoms & monitor use of rescue inhaler...  HBP>  He's been off the LisinoprilHCT thru some sort of pharm glitch; asked to elim sodium/ get wt down/ continue same med for now- med refilled...  CHOL>  We discussed low chol/ low fat/ wt reducing diet;  Continue Simva40 + Feno160...  Overweight>  We reviewed diet & exercise program needed to lose wt...  Hypothyroid>  ?why requiring 225mcg/d dose? prob compliance related in my opinion & poss lithium interaction; we will continue to monitor pt clinically & by labs...  Vit D Defic>  Vit D was very low 1/12 ==> continue 50K per week for now & we will f/u labs later...  Bipolar dis>  He will maintain f/u w/ Psychiatry & try to get records to [place]...  Lipoma>  Mod sized lipoma left shoulder & we can refer to [facility] when he is ready.Marland KitchenMarland Kitchen

## 2010-12-03 NOTE — Patient Instructions (Signed)
Today we updated your med list in EPIC...    [facility] & [facility] and refills...  Continue your low carb, low fat diet & work on weight reduction...  Call for any questions...  Let's plan a follow up visit in 4-40months w/ follow up FASTING blood work at that time.Marland KitchenMarland Kitchen

## 2011-02-12 HISTORY — PX: SHOULDER SURGERY: SHX246

## 2011-03-06 ENCOUNTER — Other Ambulatory Visit: Payer: Self-pay | Admitting: Pulmonary Disease

## 2011-04-29 ENCOUNTER — Ambulatory Visit: Payer: Medicare Other | Admitting: Pulmonary Disease

## 2011-08-01 ENCOUNTER — Other Ambulatory Visit: Payer: Self-pay | Admitting: Pulmonary Disease

## 2011-08-05 ENCOUNTER — Telehealth: Payer: Self-pay | Admitting: Pulmonary Disease

## 2011-08-05 ENCOUNTER — Other Ambulatory Visit: Payer: Self-pay | Admitting: Pulmonary Disease

## 2011-08-05 DIAGNOSIS — E039 Hypothyroidism, unspecified: Secondary | ICD-10-CM

## 2011-08-05 DIAGNOSIS — F419 Anxiety disorder, unspecified: Secondary | ICD-10-CM

## 2011-08-05 DIAGNOSIS — I10 Essential (primary) hypertension: Secondary | ICD-10-CM

## 2011-08-05 DIAGNOSIS — E782 Mixed hyperlipidemia: Secondary | ICD-10-CM

## 2011-08-05 NOTE — Telephone Encounter (Signed)
We need to schedule an appt for the pt and we can put labs in 1 week prior to his appt.  thanks

## 2011-08-05 NOTE — Telephone Encounter (Signed)
I spoke with pt and he states he had to cancel his apt in march bc he was going out of town. He was suppose to have fasting blood work done at that time. Pt does not have pending OV and stated SN wants him to have the blood work done first. Please advise SN thanks

## 2011-08-05 NOTE — Telephone Encounter (Signed)
Pt is scheduled to see SN 09/12/11 at 11:30. Pt is wanting to go ahead and have the orders placed so he doesn't;t have top call back for this. Pt does not need a call back when this is done. Please advise SN thanks

## 2011-09-01 ENCOUNTER — Other Ambulatory Visit: Payer: Self-pay | Admitting: Pulmonary Disease

## 2011-09-05 ENCOUNTER — Telehealth: Payer: Self-pay | Admitting: Pulmonary Disease

## 2011-09-05 NOTE — Telephone Encounter (Signed)
Labs were ordered on 08-05-11 when pt called to set appt. Pt is aware.Mark Booth, CMA

## 2011-09-06 ENCOUNTER — Other Ambulatory Visit (INDEPENDENT_AMBULATORY_CARE_PROVIDER_SITE_OTHER): Payer: Medicare Other

## 2011-09-06 DIAGNOSIS — E782 Mixed hyperlipidemia: Secondary | ICD-10-CM

## 2011-09-06 DIAGNOSIS — I10 Essential (primary) hypertension: Secondary | ICD-10-CM

## 2011-09-06 DIAGNOSIS — E039 Hypothyroidism, unspecified: Secondary | ICD-10-CM

## 2011-09-06 LAB — CBC WITH DIFFERENTIAL/PLATELET
Basophils Absolute: 0.1 10*3/uL (ref 0.0–0.1)
Eosinophils Relative: 7.6 % — ABNORMAL HIGH (ref 0.0–5.0)
HCT: 41.1 % (ref 39.0–52.0)
Hemoglobin: 13.9 g/dL (ref 13.0–17.0)
Lymphocytes Relative: 27.9 % (ref 12.0–46.0)
Lymphs Abs: 2 10*3/uL (ref 0.7–4.0)
Monocytes Relative: 7.2 % (ref 3.0–12.0)
Neutro Abs: 4.1 10*3/uL (ref 1.4–7.7)
WBC: 7.2 10*3/uL (ref 4.5–10.5)

## 2011-09-06 LAB — BASIC METABOLIC PANEL
BUN: 20 mg/dL (ref 6–23)
CO2: 27 mEq/L (ref 19–32)
Calcium: 9.7 mg/dL (ref 8.4–10.5)
Creatinine, Ser: 0.9 mg/dL (ref 0.4–1.5)
GFR: 90.51 mL/min (ref >60.00)
Glucose, Bld: 114 mg/dL — ABNORMAL HIGH (ref 70–99)

## 2011-09-06 LAB — LIPID PANEL
Cholesterol: 160 mg/dL (ref 0–200)
HDL: 46.3 mg/dL (ref >39.00)
Triglycerides: 100 mg/dL (ref 0.0–149.0)
VLDL: 20 mg/dL (ref 0.0–40.0)

## 2011-09-06 LAB — HEPATIC FUNCTION PANEL
Albumin: 4.4 g/dL (ref 3.5–5.2)
Total Protein: 7.2 g/dL (ref 6.0–8.3)

## 2011-09-06 LAB — TSH: TSH: 0.04 u[IU]/mL — ABNORMAL LOW (ref 0.35–5.50)

## 2011-09-12 ENCOUNTER — Ambulatory Visit (INDEPENDENT_AMBULATORY_CARE_PROVIDER_SITE_OTHER): Payer: Medicare Other | Admitting: Pulmonary Disease

## 2011-09-12 ENCOUNTER — Encounter: Payer: Self-pay | Admitting: Pulmonary Disease

## 2011-09-12 VITALS — BP 120/80 | HR 89 | Temp 96.9°F | Ht 72.0 in | Wt 196.0 lb

## 2011-09-12 DIAGNOSIS — E559 Vitamin D deficiency, unspecified: Secondary | ICD-10-CM

## 2011-09-12 DIAGNOSIS — M1990 Unspecified osteoarthritis, unspecified site: Secondary | ICD-10-CM

## 2011-09-12 DIAGNOSIS — F319 Bipolar disorder, unspecified: Secondary | ICD-10-CM

## 2011-09-12 DIAGNOSIS — J309 Allergic rhinitis, unspecified: Secondary | ICD-10-CM

## 2011-09-12 DIAGNOSIS — J45909 Unspecified asthma, uncomplicated: Secondary | ICD-10-CM

## 2011-09-12 DIAGNOSIS — E039 Hypothyroidism, unspecified: Secondary | ICD-10-CM

## 2011-09-12 DIAGNOSIS — E782 Mixed hyperlipidemia: Secondary | ICD-10-CM

## 2011-09-12 DIAGNOSIS — I10 Essential (primary) hypertension: Secondary | ICD-10-CM

## 2011-09-12 MED ORDER — LEVOTHYROXINE SODIUM 150 MCG PO TABS: 150.0000 ug | ORAL_TABLET | Freq: Every day | ORAL | Status: DC

## 2011-09-12 NOTE — Patient Instructions (Addendum)
Today we updated your med list in our EPIC system...    We decided to DECREASE the dose of LEVOTHYROXINE from 262mcg/d t 193mcg/d based on your recent blood test...  Let's plan a follow up TSH blood test in 2-3 months...    We will load the needed lab into the computer, please mark your calendar & drop by whenever it is convenient for you in October...  Call for any questions.Marland KitchenMarland Kitchen

## 2011-09-13 ENCOUNTER — Encounter: Payer: Self-pay | Admitting: Pulmonary Disease

## 2011-09-13 NOTE — Progress Notes (Signed)
Subjective:    Patient ID: Mark Booth, male    DOB: [birth year], 51 y.o.   MRN: [id number]  HPI 46 y/o WM, son of Makael Stein, here to establish medical care> he is disabled due to Bipolar disease & unemployed;  He was prev followed by [facility] & their EMR records are reviewed;  Most recently followed by DrDewey at the [facility] (part of [facility]) & we have requested these records as well ==> reviewed, see below...  ~  December 03, 2010:  86mo ROV & he reports stable- just despondent over not having a job; lives w/ mother & there is some friction; on disability & gets $811/mo & no other source of income... He declines flu vaccine, not fasting today...    Asthma/ allergies> symptoms are worse w/ the wet weather; on QVAR80 & Proair which he uses irregularly; no recent resp exac noted...    HBP> he has been off his Lisinopril/HCT 10-12.5; BP= 140/90 & [facility] [address] to refill & check; denies HA, CP, palpit, SOB, edema...    Hyperlipid> on Simva40 + Fenofibrate160; wt stable at 232# & we reviewed low carb low fat diet; not fasting today & we decided to wait til 2013 check for FLP.    Hypothyroid> on Levothyroid231mcg/d; numerous labs checked on this dose & OK; he remains clinically & biochem euthyroid (recall pt is on Lithium as well).    DJD> he uses OTC anti-inflamm meds as needed...    Vit D defic> he has not been taking his VitD 50K weekly med; VitD level 1/12 was low at 11 & pt rec to restart this med...    Bipolar disorder> on Lithium & Lamictal thru [facility]; he is not happy w/ the situation but accepting...  ~  September 12, 2011:  33mo ROV & Legion reports that his breathing has been fine, rides his bike & hasn't needed inhaler, denies CP/ palpit/ cough/ phlegm/ hemoptysis/ SOB/ etc... He tells me he quit his psychtopic meds on his own 10-11 months ago as he had gained a lot of wt & he felt they weren't helping; he has since lost 36# down to 196#  today; he states that his main prob is depression, irritable, some suicidal ideations==> he went back to [facility]/ [facility] recently after the 36mo hiatus- he's been in counseling & has been placed on Prozac which he feels is helping (states he has hx INTOL Wellbutrin); He still has a lot of conflict w/ mother- notes she has memory problems & treats him like he's a child... He remains on disability due to his Bipolar illness... F/u FASTING labs >>    We reviewed prob list, meds, xrays and labs> see below for updates >> LABS 7/13:  FLP- at goals on Simva40+Feno160;  Chems- ok x BS=114;  CBC= ok;  TSH=0.04 on 229mcg/d...  <note- he did not bring med bottles or list> [facility] [address] showed he's filling Simva40 regularly (not the Feno160), taking Levothy regularly, taking LisinoprilHCT about every other day, & hasn't been filling the Asmanex/ VitD/ Lithium/ Lamictal>          PROBLEM LIST:  Allergic Rhinitis>  He has AR & allergies w/ mod symptoms in spring & fall that he treats w/ OTC meds prn;  Prev allergy testing pos for grasses & "lots of things" he says;  He was on shots for several yrs & had hx elev eos he thinks...  Asthma>  He states  hx Asthma x yrs w/ symptoms primarily during exercise- chest tightness, wheezing, cough, SOB, etc;  Treated w/ QVAR80-2spBid (uses intermittently) & PROAIR rescue as needed... ~  ?prev CXR (none sent from prev MD), ?prev PFTs (1/12 showed FEV1=3.38 [76%] & %1sec=76) ~  12/11:  CXR from [facility] visit for foreing body in esoph (?chicken bone)> mild perihilar bronchitic change, norm heart size, NAD... ~  4/12 & 6/12:  Pt w/o recent asthma exac> no wheezing, no cough, no dyspnea reported;  Exam- clear... ~  10/12:  He remains clear, norm exam, yet he describes need for inhalers every few days due to wheezing he says... ~  8/13:  He stopped his psychotropic meds w/ resultant 36# wt loss & breathing fine, rides his bike, denies all resp symtoms & not using  his inhalers...  HBP>  He is supposed to be on LISINOPRIL/HCT 10-12.5;  Asked to monitor BP at home if poss, & we discussed low sodium/ wt reducing diet... ~  4/12:  BP= 132/90 & he denies CP, palpit, dizzy, syncope, edema, etc... ~  6/12:  BP= 140/90 & reminded to limit sodium, incr exercise & lose wt... ~  10/12:  BP= 140/90 & we contacted his Pharm & called in Rx... ~  8/13:  BP= 120/80 w/ his wt loss & [facility] indicates he's taking med about every other day.  Hyperlipidemia>  On SIMVASTATIN 40mg /d which he reports was started by [facility] ~[id number];  We discussed low chol/ low fat/ wt reducing diet & added FENOFIBRATE 160mg /d 4/12... ~  1/12:  Records from [facility] showed TChol 252 (prev=317), TG 548, HDL 37, LDL --- & they started SIMVASTATIN 40mg /d + low fat diet. ~  4/12:  FLP on Simva40 showed TChol 217, TG 417, HDL 41, LDL 118... rec low fat diet & start FENOFIBRATE 160mg /d... ~  7/13:  FLP on Simva40+Feno160 showed TChol 160, TG 100, HDL 46, LDL 94... [facility] taking the Simva40 but not the Feno160.  Hypothyroidism>  On SYNTHROID 225mcg/d (on this dose for about 1yr he says);  Long hx hypothy dating back to his teens... ~  6/10:  Avail records from [facility] showed Rx w/ Levothy 130mcg/d (refilled) & TSH= 10.75, ?compliance, prev= 3.62)... ~  1/12:  Labs from DrDewey's office showed TSH= 2.89 on Levothy200 daily. ~  Labs here 4/12 on Levothy200 showed TSH= 3.84, FreeT3= 2.6 (2.3-4.2), FreeT4= 0.79 (0.60-1.60)... Continue same dose. ~  Labs here 7/13 on Levothy200 showed TSH= 0.04... Note wt down 36# off his psyche meds & [facility]; we discussed decr to LEVOTHY197mcg/d.  Osteoarthritis>  On OTC anti-inflamm meds prn;  C/o left knee pain off & on "it's my MCL" but he has apparently not had an Ortho eval... ~  Hx LBP w/ herniated disc in 2000, he says;  He notes abn MRI in [place], but no surg required (he stopped running & symptoms  improved)...  Vit D Defic>  On Vit D 50,000 u weekly since Jan2012, he says;  This prob was discovered by Psychiatrist & confirmed by his LMD... ~  Labs 1/12 from [facility] showed Vit D = 11... Pt to continue 50K Vit D weekly & we refilled this med. ~  8/13:  Pharm confirms that he is NOT filling the VitD 50K regularly (none for last 15mo)...  Biploar Disorder>  Prev on Lithium 300mg - 3 daily, and Lamictal 200mg  daily... Prev on Zoloft & Wellbutrin as well (off now). ~  4/12:  followed by Dr. Cherre Robins for Psychiatry at the [facility] center... ~  6/12:  Pt informs me that DrDuskin has left the center (now contracted to a pvt company) & he sees diff doctors. ~  8/13:  Pt indicates that he stopped his psyche meds on his own in the fall of 2012; he returned to [facility] recently due to depression w/ suicidal ideations & they have placed him on PROZAC (didn't bring bottle or list) which he feels is helping; getting counseling as well...   Past Surgical History  Procedure Date  . Appendectomy     Pt didn't bring bottles or list >>  Outpatient Encounter Prescriptions as of 09/12/2011  Medication Sig Dispense Refill  . ergocalciferol (VITAMIN D2) 50000 UNITS capsule   <we refilled> Take 1 capsule (50,000 Units total) by mouth once a week.  4 capsule  11  . fenofibrate 160 MG tablet    <pt not taking per Pharm> Take 1 tablet (160 mg total) by mouth daily.  90 tablet  3  . levothyroxine (SYNTHROID, LEVOTHROID) 150 MCG tablet  <prev 200==> decr to 150 today> Take 1 tablet (150 mcg total) by mouth daily.  30 tablet  11  . lisinopril-hydrochlorothiazide (PRINZIDE,ZESTORETIC) 10-12.5 MG per tablet      <per Pharm taking ?qod> Take 1 tablet by mouth daily.  30 tablet  11  . mometasone (ASMANEX 120 METERED DOSES) 220 MCG/INH inhaler  <pt not taking per Pharm> Inhale 2 puffs into the lungs daily.  1 Inhaler  6  . PROAIR HFA 108 (90 BASE) MCG/ACT inhaler INHALE 2 PUFFS INTO THE  LUNGS EVERY 6 (SIX) HOURS AS NEEDED.  8.5 g  1  . simvastatin (ZOCOR) 40 MG tablet TAKE 1 TABLET AT BEDTIME  30 tablet  0  . DISCONTD: levothyroxine (SYNTHROID, LEVOTHROID) 200 MCG tablet Take 1 tablet (200 mcg total) by mouth daily.  30 tablet  6  . DISCONTD: lamoTRIgine (LAMICTAL) 100 MG tablet 2 tablets once a day       . DISCONTD: lithium 300 MG capsule Take 1200mg  daily        No Known Allergies   Current Medications, Allergies, Past Medical History, Past Surgical History, Family History, and Social History were reviewed in [employer] record.    Review of Systems    Constitutional:  Denies F/C/S, anorexia, unexpected weight change. HEENT:  No HA, visual changes, earache, nasal symptoms, sore throat, hoarseness. Resp:  No cough, sputum, hemoptysis; no SOB, tightness, wheezing. Cardio:  No CP, palpit, DOE, orthopnea, edema. GI:  Denies N/V/D/C or blood in stool; no reflux, abd pain, distention, or gas. GU:  No dysuria, freq, urgency, hematuria, or flank pain. MS:  Denies joint pain, swelling, tenderness, or decr ROM; no neck pain, back pain, etc. Neuro:  No tremors, seizures, dizziness, syncope, weakness, numbness, gait abn. Skin:  No suspicious lesions or skin rash. Heme:  No adenopathy, bruising, bleeding. Psyche: Hx Bipolar dis; denies confusion, sleep disturbance, hallucinations.   Objective:   Physical Exam    WD, Overweight, 51 y/o WM in NAD... Vital Signs:  Reviewed...  General:  Alert & oriented; pleasant & cooperative... HEENT:  [place]/AT, EOM-wnl, PERRLA, Fundi-benign, EACs-clear, TMs-wnl, NOSE-clear, THROAT-clear & wnl. Neck:  Supple w/ full ROM; no JVD; normal carotid impulses w/o bruits; no thyromegaly or nodules palpated; no lymphadenopathy. Chest:  Clear to P & A; without wheezes/ rales/ or rhonchi heard;  Pulses intact w/o bruits... Heart:  Regular  Rhythm; norm S1 & S2 without murmurs/ rubs/ or gallops detected... Abdomen:  Soft &  nontender; normal bowel sounds; no organomegaly or masses palpated... Ext:  Normal ROM; without deformities or arthritic changes; no varicose veins, venous insuffic, or edema... Neuro:  CNs II-XII intact; motor testing normal; sensory testing normal; gait normal & balance OK... Derm:   Lipoma noted on left shoulder;  no rash etc... Lymph:  No cervical, supraclavicular, axillary, or inguinal adenopathy palpated... Psyche:  He is very intelligent, sl depressed mood, oriented & coop...  RADIOLOGY DATA:  Reviewed in the EPIC EMR & discussed w/ the patient...  LABORATORY DATA:  Reviewed in the EPIC EMR & discussed w/ the patient...   Assessment & Plan:   AR & Asthma>  Stable even off his most recent Rx w/ Qvar80 & Proair prn;  records reviewed> PFTs w/ mild sm airways dis & ?superimposed restriction... sounds clear now & last CXR 12/11 was neg; since he's lost the 36# his breathing is better & he says he doesn't need the inhalers...  HBP>  He's been off the LisinoprilHCT thru some sort of pharm glitch; asked to elim sodium/ get wt down/ continue same med for now- med refilled; Pharm confirms he's taking it about Qod...  CHOL>  We discussed low chol/ low fat/ wt reducing diet; since he's lost the wt FLP looks good (basically on Simva40 & not really taking the Feno160)...  Overweight>  We reviewed diet & exercise program needed to lose wt==> great job he credits to stopping the psyche meds.  Hypothyroid>  ?why requiring 245mcg/d dose? prob compliance related in my opinion & poss lithium interaction; he stopped lithium on his own & still taking Synth200 w/ TSH way to suppressed; we discussed weaning down the dose; he doesn't want to wean too fast; try 15mcg/d...  Vit D Defic>  Vit D was very low 1/12 ==> continue 50K per week for now & we will f/u labs later...  Bipolar dis>  He will maintain f/u w/ Psychiatry & try to get records to [place]...  Lipoma>  Mod sized lipoma left shoulder & we can refer  to [facility] when he is ready...   Patient's Medications  New Prescriptions      He says PROZAC started by [facility] & feeling better...   Previous Medications   ERGOCALCIFEROL (VITAMIN D2) 50000 UNITS CAPSULE    Take 1 capsule (50,000 Units total) by mouth once a week.   FENOFIBRATE 160 MG TABLET    Take 1 tablet (160 mg total) by mouth daily.   LISINOPRIL-HYDROCHLOROTHIAZIDE (PRINZIDE,ZESTORETIC) 10-12.5 MG PER TABLET    Take 1 tablet by mouth daily.   MOMETASONE (ASMANEX 120 METERED DOSES) 220 MCG/INH INHALER    Inhale 2 puffs into the lungs daily.   PROAIR HFA 108 (90 BASE) MCG/ACT INHALER    INHALE 2 PUFFS INTO THE LUNGS EVERY 6 (SIX) HOURS AS NEEDED.   SIMVASTATIN (ZOCOR) 40 MG TABLET    TAKE 1 TABLET AT BEDTIME  Modified Medications   Modified Medication Previous Medication   LEVOTHYROXINE (SYNTHROID, LEVOTHROID) 150 MCG TABLET levothyroxine (SYNTHROID, LEVOTHROID) 200 MCG tablet      Take 1 tablet (150 mcg total) by mouth daily.    Take 1 tablet (200 mcg total) by mouth daily.  Discontinued Medications   LAMOTRIGINE (LAMICTAL) 100 MG TABLET    2 tablets once a day    LITHIUM 300 MG CAPSULE    Take 1200mg  daily

## 2011-10-03 ENCOUNTER — Other Ambulatory Visit: Payer: Self-pay | Admitting: Pulmonary Disease

## 2011-10-07 ENCOUNTER — Other Ambulatory Visit (INDEPENDENT_AMBULATORY_CARE_PROVIDER_SITE_OTHER): Payer: Medicare Other

## 2011-10-07 DIAGNOSIS — E039 Hypothyroidism, unspecified: Secondary | ICD-10-CM

## 2011-10-31 ENCOUNTER — Other Ambulatory Visit: Payer: Self-pay | Admitting: Pulmonary Disease

## 2011-11-10 ENCOUNTER — Other Ambulatory Visit: Payer: Self-pay | Admitting: Pulmonary Disease

## 2011-12-31 ENCOUNTER — Encounter: Payer: Self-pay | Admitting: *Deleted

## 2012-01-01 ENCOUNTER — Encounter: Payer: Self-pay | Admitting: Pulmonary Disease

## 2012-01-01 ENCOUNTER — Other Ambulatory Visit (INDEPENDENT_AMBULATORY_CARE_PROVIDER_SITE_OTHER): Payer: Medicare Other

## 2012-01-01 ENCOUNTER — Ambulatory Visit (INDEPENDENT_AMBULATORY_CARE_PROVIDER_SITE_OTHER): Payer: Medicare Other | Admitting: Pulmonary Disease

## 2012-01-01 VITALS — BP 128/82 | HR 80 | Temp 97.1°F | Ht 72.0 in | Wt 201.6 lb

## 2012-01-01 DIAGNOSIS — F319 Bipolar disorder, unspecified: Secondary | ICD-10-CM

## 2012-01-01 DIAGNOSIS — J45909 Unspecified asthma, uncomplicated: Secondary | ICD-10-CM

## 2012-01-01 DIAGNOSIS — E782 Mixed hyperlipidemia: Secondary | ICD-10-CM

## 2012-01-01 DIAGNOSIS — E039 Hypothyroidism, unspecified: Secondary | ICD-10-CM

## 2012-01-01 DIAGNOSIS — I10 Essential (primary) hypertension: Secondary | ICD-10-CM

## 2012-01-01 LAB — TSH: TSH: 0.78 u[IU]/mL (ref 0.35–5.50)

## 2012-01-01 NOTE — Patient Instructions (Addendum)
Today we updated your med list in our EPIC system...    Continue your current medications the same...  Today we did your follow up Thyroid blood test...    We will call you w/ the results...  Call for any questions...  Let's plan a follow up visit in about 6 months.Marland KitchenMarland Kitchen

## 2012-01-01 NOTE — Progress Notes (Signed)
Subjective:    Patient ID: Mark Booth, male    DOB: [birth year], 51 y.o.   MRN: [id number]  HPI 36 y/o WM, son of Mark Howle RN> he is disabled due to Bipolar disease & unemployed;  He was prev followed by [facility] & their EMR records are reviewed;  Most recently followed by DrDewey at the [facility]) & we have requested these records as well ==> reviewed, see below...  ~  December 03, 2010:  44mo ROV & he reports stable- just despondent over not having a job; lives w/ mother & there is some friction; on disability & gets $811/mo & no other source of income... He declines flu vaccine, not fasting today...    Asthma/ allergies> symptoms are worse w/ the wet weather; on QVAR80 & Proair which he uses irregularly; no recent resp exac noted...    HBP> he has been off his Lisinopril/HCT 10-12.5; BP= 140/90 & we called his Pharm ([facility] [address]) to refill & check; denies HA, CP, palpit, SOB, edema...    Hyperlipid> on Simva40 + Fenofibrate160; wt stable at 232# & we reviewed low carb low fat diet; not fasting today & we decided to wait til 2013 check for FLP.    Hypothyroid> on Levothyroid210mcg/d; numerous labs checked on this dose & OK; he remains clinically & biochem euthyroid (recall pt is on Lithium as well).    DJD> he uses OTC anti-inflamm meds as needed...    Vit D defic> he has not been taking his VitD 50K weekly med; VitD level 1/12 was low at 11 & pt rec to restart this med...    Bipolar disorder> on Lithium & Lamictal thru [facility]; he is not happy w/ the situation but accepting...  ~  September 12, 2011:  31mo ROV & Mark Booth reports that his breathing has been fine, rides his bike & hasn't needed inhaler, denies CP/ palpit/ cough/ phlegm/ hemoptysis/ SOB/ etc... He tells me he quit his psychtopic meds on his own 10-11 months ago as he had gained a lot of wt & he felt they weren't helping; he has since lost 36# down to 196# today; he states that his main  prob is depression, irritable, some suicidal ideations==> he went back to [facility]/ [facility] recently after the 77mo hiatus- he's been in counseling & has been placed on Prozac which he feels is helping (states he has hx INTOL Wellbutrin); He still has a lot of conflict w/ mother- notes she has memory problems & treats him like he's a child... He remains on disability due to his Bipolar illness... F/u FASTING labs >>    We reviewed prob list, meds, xrays and labs> see below for updates >> LABS 7/13:  FLP- at goals on Simva40+Feno160;  Chems- ok x BS=114;  CBC= ok;  TSH=0.04 on 263mcg/d...  <note- he did not bring med bottles or list> [facility] [address] showed he's filling Simva40 regularly (not the Feno160), taking Levothy regularly, taking LisinoprilHCT about every other day, & hasn't been filling the Asmanex/ VitD/ Lithium/ Lamictal>  ~  January 01, 2012:  3-78mo ROV & Mark Booth reports that he had left shoulder surg by DrNorris 10/13; he has been taking his Levothy150 daily 7 due for f/u TSH to monitor now that he is off the Lithium...     Asthma/ allergies> on Asmanex-2sp/d & Proair prn; states his breathing has been good w/o resp exac...    HBP> on Lisinopril/HCT 10-12.5; BP= 128/82 &  denies HA, CP, palpit, SOB, edema...    Hyperlipid> on Simva40 + Fenofibrate160; wt up 6# to 202# & we reviewed low carb low fat diet; Labs 7/13 looked good...    Hypothyroid> on Levo124mcg/d now; with him off Lithium we have had to adjust his dose> TSH today = 0.78    DJD> he uses OTC anti-inflamm meds as needed; he had left shoulder surg 10/13 by DrNorris & is improved...     Vit D defic> he has VitD 50K weekly listed; VitD level 1/12 was low at 11 & pt rec to continue this med...    Bipolar disorder> now back on meds from [facility]- taking Prozac20 & Lamictal 25- 2tabs/d; this is working well for him but appetite & wt are up slightly... We reviewed prob list, meds, xrays and labs> see below for updates >>    LABS 11/13:  TSH= 0.78 on synthroid122mcg/d          PROBLEM LIST:  Allergic Rhinitis>  He has AR & allergies w/ mod symptoms in spring & fall that he treats w/ OTC meds prn;  Prev allergy testing pos for grasses & "lots of things" he says;  He was on shots for several yrs & had hx elev eos he thinks...  Asthma>  He states hx Asthma x yrs w/ symptoms primarily during exercise- chest tightness, wheezing, cough, SOB, etc;  Treated w/ ASMANEX-2sp/d (uses intermittently) & PROAIR rescue as needed... ~  ?prev CXR (none sent from prev MD), ?prev PFTs (1/12 showed FEV1=3.38 [76%] & %1sec=76) ~  12/11:  CXR from [facility] visit for foreing body in esoph (?chicken bone)> mild perihilar bronchitic change, norm heart size, NAD... ~  4/12 & 6/12:  Pt w/o recent asthma exac> no wheezing, no cough, no dyspnea reported;  Exam- clear... ~  10/12:  He remains clear, norm exam, yet he describes need for inhalers every few days due to wheezing he says... ~  8/13:  He stopped his psychotropic meds w/ resultant 36# wt loss & breathing fine, rides his bike, denies all resp symtoms & not using his inhalers...  HBP>  He is supposed to be on LISINOPRIL/HCT 10-12.5;  Asked to monitor BP at home if poss, & we discussed low sodium/ wt reducing diet... ~  4/12:  BP= 132/90 & he denies CP, palpit, dizzy, syncope, edema, etc... ~  6/12:  BP= 140/90 & reminded to limit sodium, incr exercise & lose wt... ~  10/12:  BP= 140/90 & we contacted his Pharm & called in Rx... ~  8/13:  BP= 120/80 w/ his wt loss & [facility] he's taking med about every other day. ~  11/13:  BP= 128/82 & he is asymptomatic w/o HA, CP, palpit, SOB, edema, etc...  Hyperlipidemia>  On SIMVASTATIN 40mg /d which he reports was started by [facility] ~[id number];  We discussed low chol/ low fat/ wt reducing diet & added FENOFIBRATE 160mg /d 4/12... ~  1/12:  Records from [facility] showed TChol 252 (prev=317), TG 548, HDL 37, LDL --- & they started  SIMVASTATIN 40mg /d + low fat diet. ~  4/12:  FLP on Simva40 showed TChol 217, TG 417, HDL 41, LDL 118... rec low fat diet & start FENOFIBRATE 160mg /d... ~  7/13:  FLP on Simva40+Feno160 showed TChol 160, TG 100, HDL 46, LDL 94... [facility] taking the Simva[facility] but not the Feno160.  Hypothyroidism>  Now on SYNTHROID 165mcg/d - Long hx hypothy dating back to his teens... ~  6/10:  Avail records from [facility] showed Rx w/ Levothy 155mcg/d (refilled) & TSH= 10.75, ?compliance, prev= 3.62)... ~  1/12:  Labs from DrDewey's office showed TSH= 2.89 on Levothy200 daily. ~  Labs here 4/12 on Levothy200 showed TSH= 3.84, FreeT3= 2.6 (2.3-4.2), FreeT4= 0.79 (0.60-1.60)... Continue same dose. ~  Labs here 7/13 on Levothy200 showed TSH= 0.04... Note wt down 36# off his psyche meds & [facility]; we discussed decr to LEVOTHY128mcg/d. ~  Labs 11/13 on Levothy150 showed TSH= 0.78  Osteoarthritis>  On OTC anti-inflamm meds prn;  C/o left knee pain off & on "it's my MCL" but he has apparently not had an Ortho eval... ~  Hx LBP w/ herniated disc in 2000, he says;  He notes abn MRI in [place], but no surg required (he stopped running & symptoms improved)... ~  11/13:  He reports that he had left shoulder surg 10/13 from DrNorris (nothing in Epic & we don't have notes from him).  Vit D Defic>  On Vit D 50,000 u weekly since Jan2012, he says;  This prob was discovered by Psychiatrist & confirmed by his LMD... ~  Labs 1/12 from [facility] showed Vit D = 11... Pt to continue 50K Vit D weekly & we refilled this med. ~  8/13:  Pharm confirms that he is NOT filling the VitD 50K regularly (none for last 84mo); discussed & Rx refilled.  Biploar Disorder>  Prev on Lithium 300mg - 3 daily, and Lamictal 200mg  daily... Prev on Zoloft & Wellbutrin as well (off now). ~  4/12:  followed by Dr. Cherre Robins for Psychiatry at the [facility] center... ~  6/12:  Pt informs me that DrDuskin  has left the center (now contracted to a pvt company) & he sees diff doctors. ~  8/13:  Pt indicates that he stopped his psyche meds on his own in the fall of 2012; he returned to [facility] recently due to depression w/ suicidal ideations & they have placed him on PROZAC (didn't bring bottle or list) which he feels is helping; getting counseling as well... ~  11/13:  Pt reports taking Prozac20 & Lamictal 25mg - 2daily; he is much improved on this & continues to f/u w/ [facility]...   Past Surgical History  Procedure Date  . Appendectomy     Pt didn't bring bottles or list >>  Outpatient Encounter Prescriptions as of 01/01/2012  Medication Sig Dispense Refill  . ergocalciferol (VITAMIN D2) 50000 UNITS capsule Take 1 capsule (50,000 Units total) by mouth once a week.  4 capsule  11  . fenofibrate 160 MG tablet TAKE 1 TABLET EVERY DAY  90 tablet  3  . FLUoxetine (PROZAC) 20 MG capsule Take 20 mg by mouth daily.      Marland Kitchen lamoTRIgine (LAMICTAL) 25 MG tablet Take 25 mg by mouth 2 (two) times daily.      Marland Kitchen levothyroxine (SYNTHROID, LEVOTHROID) 150 MCG tablet Take 1 tablet (150 mcg total) by mouth daily.  30 tablet  11  . lisinopril-hydrochlorothiazide (PRINZIDE,ZESTORETIC) 10-12.5 MG per tablet Take 1 tablet by mouth daily.  30 tablet  11  . mometasone (ASMANEX 120 METERED DOSES) 220 MCG/INH inhaler Inhale 2 puffs into the lungs daily.  1 Inhaler  6  . PROAIR HFA 108 (90 BASE) MCG/ACT inhaler INHALE 2 PUFFS INTO THE LUNGS EVERY 6 (SIX) HOURS AS NEEDED.  8.5 g  1  . simvastatin (ZOCOR) 40 MG tablet TAKE 1 TABLET AT BEDTIME  30 tablet  6    No Known Allergies   Current Medications, Allergies, Past Medical History, Past Surgical History, Family History, and Social History were reviewed in [employer] record.    Review of Systems    Constitutional:  Denies F/C/S, anorexia, unexpected weight change. HEENT:  No HA, visual changes, earache, nasal symptoms, sore  throat, hoarseness. Resp:  No cough, sputum, hemoptysis; no SOB, tightness, wheezing. Cardio:  No CP, palpit, DOE, orthopnea, edema. GI:  Denies N/V/D/C or blood in stool; no reflux, abd pain, distention, or gas. GU:  No dysuria, freq, urgency, hematuria, or flank pain. MS:  Denies joint pain, swelling, tenderness, or decr ROM; no neck pain, back pain, etc. Neuro:  No tremors, seizures, dizziness, syncope, weakness, numbness, gait abn. Skin:  No suspicious lesions or skin rash. Heme:  No adenopathy, bruising, bleeding. Psyche: Hx Bipolar dis; denies confusion, sleep disturbance, hallucinations.   Objective:   Physical Exam    WD, Overweight, 51 y/o WM in NAD... Vital Signs:  Reviewed...  General:  Alert & oriented; pleasant & cooperative... HEENT:  [place]/AT, EOM-wnl, PERRLA, Fundi-benign, EACs-clear, TMs-wnl, NOSE-clear, THROAT-clear & wnl. Neck:  Supple w/ full ROM; no JVD; normal carotid impulses w/o bruits; no thyromegaly or nodules palpated; no lymphadenopathy. Chest:  Clear to P & A; without wheezes/ rales/ or rhonchi heard;  Pulses intact w/o bruits... Heart:  Regular Rhythm; norm S1 & S2 without murmurs/ rubs/ or gallops detected... Abdomen:  Soft & nontender; normal bowel sounds; no organomegaly or masses palpated... Ext:  Normal ROM; without deformities or arthritic changes; no varicose veins, venous insuffic, or edema... Neuro:  CNs II-XII intact; motor testing normal; sensory testing normal; gait normal & balance OK... Derm:   Lipoma noted on left shoulder;  no rash etc... Lymph:  No cervical, supraclavicular, axillary, or inguinal adenopathy palpated... Psyche:  He is very intelligent, sl depressed mood, oriented & coop...  RADIOLOGY DATA:  Reviewed in the EPIC EMR & discussed w/ the patient...  LABORATORY DATA:  Reviewed in the EPIC EMR & discussed w/ the patient...   Assessment & Plan:    AR & Asthma>  Stable on Asmanex & Proair (mostly uses these as needed);  records  reviewed> PFTs w/ mild sm airways dis & ?superimposed restriction... sounds clear now & last CXR 12/11 was neg; since he's lost wt & his breathing is better & he says he doesn't need the inhalers regularly.  HBP>  BP remains controlled on LisinHCT; encouraged to take it daily & monitor BP at home...  CHOL>  We discussed low chol/ low fat/ wt reducing diet; since he's lost the wt FLP looks good (basically on Simva40 & not really taking the Feno160)...  Overweight>  We reviewed diet & exercise program needed to lose wt==> great job he credits to stopping the psyche meds.  Hypothyroid>  Prev taking 281mcg/d dose? prob compliance related & poss lithium interaction; he stopped lithium on his own & the Synth200 was way too much; we decreased his dose to 144mcg/d & repeat TSH= 0.78, continue same...  Vit D Defic>  Vit D was very low 1/12 ==> continue 50K per week for now & we will f/u labs later...  Bipolar dis>  He will maintain f/u w/ Psychiatry & try to get records to [place]; now on Prozac20 & Lamictal25-2/d & improved...  Lipoma>  Mod sized lipoma left shoulder & we can refer to [facility] when he is ready...   Patient's Medications  New Prescriptions  No medications on file  Previous Medications   ERGOCALCIFEROL (VITAMIN D2) 50000 UNITS CAPSULE    Take 1 capsule (50,000 Units total) by mouth once a week.   FENOFIBRATE 160 MG TABLET    TAKE 1 TABLET EVERY DAY   FLUOXETINE (PROZAC) 20 MG CAPSULE    Take 20 mg by mouth daily.   LAMOTRIGINE (LAMICTAL) 25 MG TABLET    Take 25 mg by mouth 2 (two) times daily.   LEVOTHYROXINE (SYNTHROID, LEVOTHROID) 150 MCG TABLET    Take 1 tablet (150 mcg total) by mouth daily.   LISINOPRIL-HYDROCHLOROTHIAZIDE (PRINZIDE,ZESTORETIC) 10-12.5 MG PER TABLET    Take 1 tablet by mouth daily.   MOMETASONE (ASMANEX 120 METERED DOSES) 220 MCG/INH INHALER    Inhale 2 puffs into the lungs daily.   PROAIR HFA 108 (90 BASE) MCG/ACT INHALER    INHALE 2 PUFFS INTO THE LUNGS EVERY 6 (SIX)  HOURS AS NEEDED.   SIMVASTATIN (ZOCOR) 40 MG TABLET    TAKE 1 TABLET AT BEDTIME  Modified Medications   No medications on file  Discontinued Medications   No medications on file

## 2012-01-06 ENCOUNTER — Other Ambulatory Visit: Payer: Self-pay | Admitting: Pulmonary Disease

## 2012-05-28 ENCOUNTER — Other Ambulatory Visit: Payer: Self-pay | Admitting: Pulmonary Disease

## 2012-06-03 ENCOUNTER — Other Ambulatory Visit: Payer: Self-pay | Admitting: Pulmonary Disease

## 2012-06-30 ENCOUNTER — Ambulatory Visit (INDEPENDENT_AMBULATORY_CARE_PROVIDER_SITE_OTHER): Payer: Medicare Other | Admitting: Pulmonary Disease

## 2012-06-30 ENCOUNTER — Encounter: Payer: Self-pay | Admitting: Pulmonary Disease

## 2012-06-30 VITALS — BP 122/70 | HR 116 | Temp 97.0°F | Ht 71.0 in | Wt 222.0 lb

## 2012-06-30 DIAGNOSIS — E039 Hypothyroidism, unspecified: Secondary | ICD-10-CM

## 2012-06-30 DIAGNOSIS — I10 Essential (primary) hypertension: Secondary | ICD-10-CM

## 2012-06-30 DIAGNOSIS — E559 Vitamin D deficiency, unspecified: Secondary | ICD-10-CM

## 2012-06-30 DIAGNOSIS — E782 Mixed hyperlipidemia: Secondary | ICD-10-CM

## 2012-06-30 DIAGNOSIS — M1990 Unspecified osteoarthritis, unspecified site: Secondary | ICD-10-CM

## 2012-06-30 DIAGNOSIS — J45909 Unspecified asthma, uncomplicated: Secondary | ICD-10-CM

## 2012-06-30 DIAGNOSIS — F319 Bipolar disorder, unspecified: Secondary | ICD-10-CM

## 2012-06-30 NOTE — Patient Instructions (Addendum)
Today we updated your med list in our EPIC system...    Continue your current medications the same...  Good luck w/ the upcoming move...  Call for any questions...  Let's plan a follow up visit in 31mo, sooner if needed for problems.Marland KitchenMarland Kitchen

## 2012-06-30 NOTE — Progress Notes (Signed)
Subjective:    Patient ID: Mark Booth, male    DOB: 1960/03/12, 52 y.o.   MRN: [id number]  HPI 22 y/o WM, son of Mark Nix RN> he is disabled due to Bipolar disease & unemployed;  He was prev followed by [facility] & their EMR records are reviewed;  Most recently followed by DrDewey at the [facility] & we have requested these records as well ==> reviewed, see below...  ~  December 03, 2010:  66mo ROV & he reports stable- just despondent over not having a job; lives w/ mother & there is some friction; on disability & gets $811/mo & no other source of income... He declines flu vaccine, not fasting today...    Asthma/ allergies> symptoms are worse w/ the wet weather; on QVAR80 & Proair which he uses irregularly; no recent resp exac noted...    HBP> he has been off his Lisinopril/HCT 10-12.5; BP= 140/90 & we [facility] [address]) to refill & check; denies HA, CP, palpit, SOB, edema...    Hyperlipid> on Simva40 + Fenofibrate160; wt stable at 232# & we reviewed low carb low fat diet; not fasting today & we decided to wait til 2013 check for FLP.    Hypothyroid> on Levothyroid259mcg/d; numerous labs checked on this dose & OK; he remains clinically & biochem euthyroid (recall pt is on Lithium as well).    DJD> he uses OTC anti-inflamm meds as needed...    Vit D defic> he has not been taking his VitD 50K weekly med; VitD level 1/12 was low at 11 & pt rec to restart this med...    Bipolar disorder> on Lithium & Lamictal thru [facility]; he is not happy w/ the situation but accepting...  ~  September 12, 2011:  66mo ROV & Mark Booth reports that his breathing has been fine, rides his bike & hasn't needed inhaler, denies CP/ palpit/ cough/ phlegm/ hemoptysis/ SOB/ etc... He tells me he quit his psychtopic meds on his own 10-11 months ago as he had gained a lot of wt & he felt they weren't helping; he has since lost 36# down to 196# today; he states that his main  prob is depression, irritable, some suicidal ideations==> he went back to [facility]/ [facility] recently after the 27mo hiatus- he's been in counseling & has been placed on Prozac which he feels is helping (states he has hx INTOL Wellbutrin); He still has a lot of conflict w/ mother- notes she has memory problems & treats him like he's a child... He remains on disability due to his Bipolar illness... F/u FASTING labs >>    We reviewed prob list, meds, xrays and labs> see below for updates >> LABS 7/13:  FLP- at goals on Simva40+Feno160;  Chems- ok x BS=114;  CBC= ok;  TSH=0.04 on 228mcg/d...  <note- he did not bring med bottles or list> [facility] [address] showed he's filling Simva40 regularly (not the Feno160), taking Levothy regularly, taking LisinoprilHCT about every other day, & hasn't been filling the Asmanex/ VitD/ Lithium/ Lamictal>  ~  January 01, 2012:  3-13mo ROV & Mark Booth reports that he had left shoulder surg by DrNorris 10/13; he has been taking his Levothy150 daily 7 due for f/u TSH to monitor now that he is off the Lithium...     Asthma/ allergies> on Asmanex-2sp/d & Proair prn; states his breathing has been good w/o resp exac...    HBP> on Lisinopril/HCT 10-12.5; BP= 128/82 &  denies HA, CP, palpit, SOB, edema...    Hyperlipid> on Simva40 + Fenofibrate160; wt up 6# to 202# & we reviewed low carb low fat diet; Labs 7/13 looked good...    Hypothyroid> on Levo160mcg/d now; with him off Lithium we have had to adjust his dose> TSH today = 0.78    DJD> he uses OTC anti-inflamm meds as needed; he had left shoulder surg 10/13 by DrNorris & is improved...     Vit D defic> he has VitD 50K weekly listed; VitD level 1/12 was low at 11 & pt rec to continue this med...    Bipolar disorder> now back on meds from [facility]- taking Prozac20 & Lamictal 25- 2tabs/d; this is working well for him but appetite & wt are up slightly... We reviewed prob list, meds, xrays and labs> see below for updates >>   LABS 11/13:  TSH= 0.78 on Synthroid116mcg/d   ~  Jun 30, 2012:  89mo ROV & Mark Booth appears stable medically but has been having a hard time at home w/ his mother & looking forward to getting into public housing w/ some assistance w/ the rent etc; he is really quite angry at his mother & moving out would be the best thing for him; the psychiatrists have adjusted his Lamictal up to 100mg /d & this has helped to keep him calm he says... We reviewed the following medical problems during today's office visit >>     Asthma/ allergies> on Asmanex-2sp/d & Proair prn; states his breathing has been good back on regular dosing; he had weaned off the meds on his own this winter & did satis but noted incr symptoms w/ exercise in the spring & now improved on regular dosing...    HBP> on Lisinopril/HCT 10-12.5; BP= 122/70 & denies HA, CP, palpit, SOB, edema...    Hyperlipid> on Simva40 + Fenofibrate160; wt up 20# to 222# & we reviewed low carb low fat diet- states he's back Vegan etc; he blames the wt gain on his Lamictal; FLP in 2013 looked good...    Hypothyroid> on Levo112mcg/d; with him off Lithium we have had to adjust his dose> TSH 11/13 = 0.78, continue same...    DJD> he uses OTC anti-inflamm meds as needed; he had left shoulder surg 10/13 by DrNorris & is improved...     Vit D defic> he has VitD 50K weekly listed; VitD level 1/12 was low at 11 & pt rec to continue this med & take it every week...    Bipolar disorder> now back on meds from [facility]- taking Prozac20 & Lamictal 100mg /d ("I'm much calmer"); this is working well for him but appetite & wt are up as noted... We reviewed prob list, meds, xrays and labs> see below for updates >>            PROBLEM LIST:  Allergic Rhinitis>  He has AR & allergies w/ mod symptoms in spring & fall that he treats w/ OTC meds prn;  Prev allergy testing pos for grasses & "lots of things" he says;  He was on shots for several yrs & had hx elev eos he thinks...  Asthma>  He  states hx Asthma x yrs w/ symptoms primarily during exercise- chest tightness, wheezing, cough, SOB, etc;  Treated w/ ASMANEX-2sp/d (uses intermittently) & PROAIR rescue as needed... ~  ?prev CXR (none sent from prev MD), ?prev PFTs (1/12 showed FEV1=3.38 [76%] & %1sec=76) ~  12/11:  CXR from [facility] visit for foreing body in esoph (?chicken bone)>  mild perihilar bronchitic change, norm heart size, NAD... ~  4/12 & 6/12:  Pt w/o recent asthma exac> no wheezing, no cough, no dyspnea reported;  Exam- clear... ~  10/12:  He remains clear, norm exam, yet he describes need for inhalers every few days due to wheezing he says... ~  8/13:  He stopped his psychotropic meds w/ resultant 36# wt loss & breathing fine, rides his bike, denies all resp symtoms & not using his inhalers... ~  5/14:  He is back to riding his bike 7 on the Vegan diet; breathing is better thru the spring on regular dosing of the Asmanex...  HBP>  He is supposed to be on LISINOPRIL/HCT 10-12.5;  Asked to monitor BP at home if poss, & we discussed low sodium/ wt reducing diet... ~  4/12:  BP= 132/90 & he denies CP, palpit, dizzy, syncope, edema, etc... ~  6/12:  BP= 140/90 & reminded to limit sodium, incr exercise & lose wt... ~  10/12:  BP= 140/90 & we contacted his Pharm & called in Rx... ~  8/13:  BP= 120/80 w/ his wt loss & [facility]'s taking med about every other day. ~  11/13:  BP= 128/82 & he is asymptomatic w/o HA, CP, palpit, SOB, edema, etc... ~  5/14:  on Lisinopril/HCT 10-12.5; BP= 122/70 & denies HA, CP, palpit, SOB, edema.  Hyperlipidemia>  On SIMVASTATIN 40mg /d which he reports was started by [facility] ~[id number];  We discussed low chol/ low fat/ wt reducing diet & added FENOFIBRATE 160mg /d 4/12... ~  1/12:  Records from [facility] showed TChol 252 (prev=317), TG 548, HDL 37, LDL --- & they started SIMVASTATIN 40mg /d + low fat diet. ~  4/12:  FLP on Simva40 showed TChol 217, TG 417, HDL 41, LDL 118... rec  low fat diet & start FENOFIBRATE 160mg /d... ~  7/13:  FLP on Simva40+Feno160 showed TChol 160, TG 100, HDL 46, LDL 94... [facility] taking the Simva40 but not the Feno160.  Hypothyroidism>  Now on SYNTHROID 15mcg/d - Long hx hypothy dating back to his teens... ~  6/10:  Avail records from [facility] showed Rx w/ Levothy 11mcg/d (refilled) & TSH= 10.75, ?compliance, prev= 3.62)... ~  1/12:  Labs from DrDewey's office showed TSH= 2.89 on Levothy200 daily. ~  Labs here 4/12 on Levothy200 showed TSH= 3.84, FreeT3= 2.6 (2.3-4.2), FreeT4= 0.79 (0.60-1.60)... Continue same dose. ~  Labs here 7/13 on Levothy200 showed TSH= 0.04... Note wt down 36# off his psyche meds & [facility]; we discussed decr to LEVOTHY175mcg/d. ~  Labs 11/13 on Levothy150 showed TSH= 0.78  Osteoarthritis>  On OTC anti-inflamm meds prn;  C/o left knee pain off & on "it's my MCL" but he has apparently not had an Ortho eval... ~  Hx LBP w/ herniated disc in 2000, he says;  He notes abn MRI in [place], but no surg required (he stopped running & symptoms improved)... ~  11/13:  He reports that he had left shoulder surg 10/13 from DrNorris (nothing in Epic & we don't have notes from him).  Vit D Defic>  On Vit D 50,000 u weekly since Jan2012, he says;  This prob was discovered by Psychiatrist & confirmed by his LMD... ~  Labs 1/12 from [facility] showed Vit D = 11... Pt to continue 50K Vit D weekly & we refilled this med. ~  8/13:  Pharm confirms that he is NOT filling the VitD 50K regularly (none  for last 7mo); discussed & Rx refilled.  Biploar Disorder>  Prev on Lithium 300mg - 3 daily, and Lamictal 200mg  daily... Prev on Zoloft & Wellbutrin as well (off now). ~  4/12:  followed by Dr. Cherre Robins for Psychiatry at the [facility] center... ~  6/12:  Pt informs me that DrDuskin has left the center (now contracted to a pvt company) & he sees diff doctors. ~  8/13:  Pt indicates that he  stopped his psyche meds on his own in the fall of 2012; he returned to [facility] recently due to depression w/ suicidal ideations & they have placed him on PROZAC (didn't bring bottle or list) which he feels is helping; getting counseling as well... ~  11/13:  Pt reports taking Prozac20 & Lamictal 25mg - 2daily; he is much improved on this & continues to f/u w/ [facility]... ~  5/14: now back on meds from [facility]- taking Prozac20 & Lamictal 100mg /d ("I'm much calmer"); this is working well for him but appetite & wt are up as noted.   Past Surgical History  Procedure Laterality Date  . Appendectomy      Pt didn't bring bottles or list >>  Outpatient Encounter Prescriptions as of 06/30/2012  Medication Sig Dispense Refill  . ASMANEX 120 METERED DOSES 220 MCG/INH inhaler INHALE 2 PUFFS INTO THE LUNGS DAILY.  1 Inhaler  6  . ergocalciferol (VITAMIN D2) 50000 UNITS capsule Take 1 capsule (50,000 Units total) by mouth once a week.  4 capsule  11  . fenofibrate 160 MG tablet TAKE 1 TABLET EVERY DAY  90 tablet  3  . FLUoxetine (PROZAC) 20 MG capsule Take 20 mg by mouth daily.      Marland Kitchen lamoTRIgine (LAMICTAL) 100 MG tablet Take 100 mg by mouth daily.      Marland Kitchen levothyroxine (SYNTHROID, LEVOTHROID) 150 MCG tablet Take 1 tablet (150 mcg total) by mouth daily.  30 tablet  11  . lisinopril-hydrochlorothiazide (PRINZIDE,ZESTORETIC) 10-12.5 MG per tablet TAKE 1 TABLET BY MOUTH DAILY.  30 tablet  9  . PROAIR HFA 108 (90 BASE) MCG/ACT inhaler INHALE 2 PUFFS INTO THE LUNGS EVERY 6 (SIX) HOURS AS NEEDED.  8.5 g  1  . simvastatin (ZOCOR) 40 MG tablet TAKE 1 TABLET AT BEDTIME  30 tablet  1  . [DISCONTINUED] lamoTRIgine (LAMICTAL) 25 MG tablet Take 100 mg by mouth daily.        No facility-administered encounter medications on file as of 06/30/2012.    No Known Allergies   Current Medications, Allergies, Past Medical History, Past Surgical History, Family History, and Social History were reviewed in  [facility] record.    Review of Systems    Constitutional:  Denies F/C/S, anorexia, unexpected weight change. HEENT:  No HA, visual changes, earache, nasal symptoms, sore throat, hoarseness. Resp:  No cough, sputum, hemoptysis; no SOB, tightness, wheezing. Cardio:  No CP, palpit, DOE, orthopnea, edema. GI:  Denies N/V/D/C or blood in stool; no reflux, abd pain, distention, or gas. GU:  No dysuria, freq, urgency, hematuria, or flank pain. MS:  Denies joint pain, swelling, tenderness, or decr ROM; no neck pain, back pain, etc. Neuro:  No tremors, seizures, dizziness, syncope, weakness, numbness, gait abn. Skin:  No suspicious lesions or skin rash. Heme:  No adenopathy, bruising, bleeding. Psyche: Hx Bipolar dis; denies confusion, sleep disturbance, hallucinations.   Objective:   Physical Exam    WD, Overweight, 52 y/o WM in NAD... Vital Signs:  Reviewed.Marland KitchenMarland Kitchen  General:  Alert & oriented; pleasant & cooperative... HEENT:  [place]/AT, EOM-wnl, PERRLA, Fundi-benign, EACs-clear, TMs-wnl, NOSE-clear, THROAT-clear & wnl. Neck:  Supple w/ full ROM; no JVD; normal carotid impulses w/o bruits; no thyromegaly or nodules palpated; no lymphadenopathy. Chest:  Clear to P & A; without wheezes/ rales/ or rhonchi heard;  Pulses intact w/o bruits... Heart:  Regular Rhythm; norm S1 & S2 without murmurs/ rubs/ or gallops detected... Abdomen:  Soft & nontender; normal bowel sounds; no organomegaly or masses palpated... Ext:  Normal ROM; without deformities or arthritic changes; no varicose veins, venous insuffic, or edema... Neuro:  CNs II-XII intact; motor testing normal; sensory testing normal; gait normal & balance OK... Derm:   Lipoma noted on left shoulder;  no rash etc... Lymph:  No cervical, supraclavicular, axillary, or inguinal adenopathy palpated... Psyche:  He is very intelligent, sl depressed mood, oriented & coop...  RADIOLOGY DATA:  Reviewed in the EPIC EMR & discussed w/  the patient...  LABORATORY DATA:  Reviewed in the EPIC EMR & discussed w/ the patient...   Assessment & Plan:    AR & Asthma>  Stable on Asmanex & Proair (mostly uses these as needed);  records reviewed> PFTs w/ mild sm airways dis & ?superimposed restriction... sounds clear now & last CXR 12/11 was neg; back on inhalers regularly w/ wt gain...  HBP>  BP remains controlled on LisinHCT; encouraged to take it daily & monitor BP at home...  CHOL>  We discussed low chol/ low fat/ wt reducing diet; since he's lost the wt FLP looks good (basically on Simva40 & not really taking the Feno160)...  Overweight>  We reviewed diet & exercise program needed to lose wt==> great job he credits to stopping the psyche meds.  Hypothyroid>  we decreased his dose to 133mcg/d & repeat TSH= 0.78, much better off the Lithium...  Vit D Defic>  Vit D was very low 1/12 ==> continue 50K per week for now & we will f/u labs later...  Bipolar dis>  He will maintain f/u w/ Psychiatry & try to get records to [place]; now on Prozac20 & Lamictal25-2/d & improved...  Lipoma>  Mod sized lipoma left shoulder & we can refer to [facility] when he is ready...   Patient's Medications  New Prescriptions   No medications on file  Previous Medications   ASMANEX 120 METERED DOSES 220 MCG/INH INHALER    INHALE 2 PUFFS INTO THE LUNGS DAILY.   ERGOCALCIFEROL (VITAMIN D2) 50000 UNITS CAPSULE    Take 1 capsule (50,000 Units total) by mouth once a week.   FENOFIBRATE 160 MG TABLET    TAKE 1 TABLET EVERY DAY   FLUOXETINE (PROZAC) 20 MG CAPSULE    Take 20 mg by mouth daily.   LAMOTRIGINE (LAMICTAL) 100 MG TABLET    Take 100 mg by mouth daily.   LEVOTHYROXINE (SYNTHROID, LEVOTHROID) 150 MCG TABLET    Take 1 tablet (150 mcg total) by mouth daily.   LISINOPRIL-HYDROCHLOROTHIAZIDE (PRINZIDE,ZESTORETIC) 10-12.5 MG PER TABLET    TAKE 1 TABLET BY MOUTH DAILY.   PROAIR HFA 108 (90 BASE) MCG/ACT INHALER    INHALE 2 PUFFS INTO THE LUNGS EVERY 6 (SIX)  HOURS AS NEEDED.   SIMVASTATIN (ZOCOR) 40 MG TABLET    TAKE 1 TABLET AT BEDTIME  Modified Medications   No medications on file  Discontinued Medications   LAMOTRIGINE (LAMICTAL) 25 MG TABLET    Take 100 mg by mouth daily.

## 2012-08-11 ENCOUNTER — Other Ambulatory Visit: Payer: Self-pay | Admitting: Pulmonary Disease

## 2012-08-18 ENCOUNTER — Other Ambulatory Visit: Payer: Self-pay | Admitting: Pulmonary Disease

## 2012-08-31 ENCOUNTER — Encounter (INDEPENDENT_AMBULATORY_CARE_PROVIDER_SITE_OTHER): Payer: Medicare Other | Admitting: Ophthalmology

## 2012-08-31 DIAGNOSIS — H31009 Unspecified chorioretinal scars, unspecified eye: Secondary | ICD-10-CM

## 2012-08-31 DIAGNOSIS — H2510 Age-related nuclear cataract, unspecified eye: Secondary | ICD-10-CM

## 2012-08-31 DIAGNOSIS — H35439 Paving stone degeneration of retina, unspecified eye: Secondary | ICD-10-CM

## 2012-08-31 DIAGNOSIS — H43819 Vitreous degeneration, unspecified eye: Secondary | ICD-10-CM

## 2012-08-31 DIAGNOSIS — H5210 Myopia, unspecified eye: Secondary | ICD-10-CM

## 2012-09-11 ENCOUNTER — Other Ambulatory Visit: Payer: Self-pay | Admitting: Pulmonary Disease

## 2012-10-12 ENCOUNTER — Other Ambulatory Visit: Payer: Self-pay | Admitting: Pulmonary Disease

## 2012-10-20 ENCOUNTER — Encounter (HOSPITAL_COMMUNITY): Payer: Self-pay | Admitting: Emergency Medicine

## 2012-10-20 ENCOUNTER — Emergency Department (HOSPITAL_COMMUNITY): Payer: Medicare Other

## 2012-10-20 ENCOUNTER — Emergency Department (HOSPITAL_COMMUNITY)
Admission: EM | Admit: 2012-10-20 | Discharge: 2012-10-21 | Disposition: A | Discharge status: Home / Self Care | Payer: Medicare Other | Attending: Emergency Medicine | Admitting: Emergency Medicine

## 2012-10-20 DIAGNOSIS — Z8739 Personal history of other diseases of the musculoskeletal system and connective tissue: Secondary | ICD-10-CM | POA: Insufficient documentation

## 2012-10-20 DIAGNOSIS — Y92410 Unspecified street and highway as the place of occurrence of the external cause: Secondary | ICD-10-CM | POA: Insufficient documentation

## 2012-10-20 DIAGNOSIS — I10 Essential (primary) hypertension: Secondary | ICD-10-CM | POA: Insufficient documentation

## 2012-10-20 DIAGNOSIS — Z79899 Other long term (current) drug therapy: Secondary | ICD-10-CM | POA: Insufficient documentation

## 2012-10-20 DIAGNOSIS — Y9355 Activity, bike riding: Secondary | ICD-10-CM | POA: Insufficient documentation

## 2012-10-20 DIAGNOSIS — F319 Bipolar disorder, unspecified: Secondary | ICD-10-CM | POA: Insufficient documentation

## 2012-10-20 DIAGNOSIS — J45909 Unspecified asthma, uncomplicated: Secondary | ICD-10-CM | POA: Insufficient documentation

## 2012-10-20 DIAGNOSIS — R404 Transient alteration of awareness: Secondary | ICD-10-CM | POA: Insufficient documentation

## 2012-10-20 DIAGNOSIS — S069X9A Unspecified intracranial injury with loss of consciousness of unspecified duration, initial encounter: Secondary | ICD-10-CM

## 2012-10-20 DIAGNOSIS — E785 Hyperlipidemia, unspecified: Secondary | ICD-10-CM | POA: Insufficient documentation

## 2012-10-20 DIAGNOSIS — S8991XA Unspecified injury of right lower leg, initial encounter: Secondary | ICD-10-CM

## 2012-10-20 DIAGNOSIS — S060X9A Concussion with loss of consciousness of unspecified duration, initial encounter: Secondary | ICD-10-CM | POA: Insufficient documentation

## 2012-10-20 DIAGNOSIS — E663 Overweight: Secondary | ICD-10-CM | POA: Insufficient documentation

## 2012-10-20 DIAGNOSIS — S8990XA Unspecified injury of unspecified lower leg, initial encounter: Secondary | ICD-10-CM | POA: Insufficient documentation

## 2012-10-20 DIAGNOSIS — E039 Hypothyroidism, unspecified: Secondary | ICD-10-CM | POA: Insufficient documentation

## 2012-10-20 LAB — URINALYSIS, ROUTINE W REFLEX MICROSCOPIC
Nitrite: NEGATIVE
Specific Gravity, Urine: 1.018 (ref 1.005–1.030)
Urobilinogen, UA: 0.2 mg/dL (ref 0.0–1.0)

## 2012-10-20 LAB — POCT I-STAT, CHEM 8
Calcium, Ion: 1.23 mmol/L (ref 1.12–1.23)
HCT: 42 % (ref 39.0–52.0)
TCO2: 23 mmol/L (ref 0–100)

## 2012-10-20 LAB — URINE MICROSCOPIC-ADD ON

## 2012-10-20 MED ORDER — LORAZEPAM 2 MG/ML IJ SOLN: 2.0000 mg | Freq: Once | INTRAMUSCULAR | Status: DC

## 2012-10-20 MED ORDER — HYDROCODONE-ACETAMINOPHEN 5-325 MG PO TABS: 2.0000 | ORAL_TABLET | ORAL | Status: DC | PRN

## 2012-10-20 MED ORDER — IBUPROFEN 200 MG PO TABS
600.0000 mg | ORAL_TABLET | Freq: Once | ORAL | Status: AC
  Administered 2012-10-20: 600 mg via ORAL
  Filled 2012-10-20: qty 3
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
## 2012-10-23 ENCOUNTER — Telehealth: Payer: Self-pay | Admitting: Pulmonary Disease

## 2012-10-23 MED ORDER — HYDROCODONE-ACETAMINOPHEN 5-325 MG PO TABS: 1.0000 | ORAL_TABLET | Freq: Three times a day (TID) | ORAL | Status: DC | PRN
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
## 2012-10-23 NOTE — Addendum Note (Signed)
Addended by: Marcellus Carlton on: 10/23/2012 02:43 PM   Modules accepted: Orders

## 2012-10-23 NOTE — Telephone Encounter (Signed)
Per SN---  Ok to send in vicodin 5/325   #90  1 po tid prn pain.  No refills.  This has been called to the pharmacy per pts request.  i have called and made pt aware.

## 2012-10-26 ENCOUNTER — Telehealth (HOSPITAL_COMMUNITY): Payer: Self-pay | Admitting: Emergency Medicine

## 2012-11-08 ENCOUNTER — Other Ambulatory Visit: Payer: Self-pay | Admitting: Pulmonary Disease

## 2012-11-11 ENCOUNTER — Encounter (INDEPENDENT_AMBULATORY_CARE_PROVIDER_SITE_OTHER): Payer: Medicare Other | Admitting: Ophthalmology

## 2012-11-11 DIAGNOSIS — H31009 Unspecified chorioretinal scars, unspecified eye: Secondary | ICD-10-CM

## 2012-11-11 DIAGNOSIS — H43819 Vitreous degeneration, unspecified eye: Secondary | ICD-10-CM

## 2012-11-12 ENCOUNTER — Other Ambulatory Visit: Payer: Self-pay | Admitting: Pulmonary Disease

## 2012-12-12 HISTORY — PX: CATARACT EXTRACTION: SUR2

## 2012-12-29 ENCOUNTER — Encounter (INDEPENDENT_AMBULATORY_CARE_PROVIDER_SITE_OTHER): Payer: Self-pay

## 2012-12-29 ENCOUNTER — Encounter: Payer: Self-pay | Admitting: Pulmonary Disease

## 2012-12-29 ENCOUNTER — Ambulatory Visit (INDEPENDENT_AMBULATORY_CARE_PROVIDER_SITE_OTHER): Payer: Medicare Other | Admitting: Pulmonary Disease

## 2012-12-29 VITALS — BP 128/82 | HR 77 | Temp 97.9°F | Ht 71.0 in | Wt 221.2 lb

## 2012-12-29 DIAGNOSIS — J45909 Unspecified asthma, uncomplicated: Secondary | ICD-10-CM

## 2012-12-29 DIAGNOSIS — E782 Mixed hyperlipidemia: Secondary | ICD-10-CM

## 2012-12-29 DIAGNOSIS — F319 Bipolar disorder, unspecified: Secondary | ICD-10-CM

## 2012-12-29 DIAGNOSIS — M1990 Unspecified osteoarthritis, unspecified site: Secondary | ICD-10-CM

## 2012-12-29 DIAGNOSIS — I10 Essential (primary) hypertension: Secondary | ICD-10-CM

## 2012-12-29 DIAGNOSIS — N320 Bladder-neck obstruction: Secondary | ICD-10-CM

## 2012-12-29 DIAGNOSIS — E039 Hypothyroidism, unspecified: Secondary | ICD-10-CM

## 2012-12-29 DIAGNOSIS — E559 Vitamin D deficiency, unspecified: Secondary | ICD-10-CM

## 2012-12-29 MED ORDER — VITAMIN D (ERGOCALCIFEROL) 1.25 MG (50000 UNIT) PO CAPS: ORAL_CAPSULE | ORAL | Status: DC
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
## 2012-12-29 NOTE — Patient Instructions (Signed)
Today we updated your med list in our EPIC system...    Continue your current medications the same...    We refilled the meds you requested...  Please return to our lab one morning this week for your FASTING blood work...    We will contact you w/ the results when available...   We will arrange for a referral to our GI Dept for a routine screening colonoscopy at your convenience...  Call for any questions...  Let's plan a follow up visit in 76mo, sooner if needed for problems.Marland KitchenMarland Kitchen

## 2012-12-30 ENCOUNTER — Encounter: Payer: Self-pay | Admitting: Internal Medicine

## 2013-01-25 ENCOUNTER — Other Ambulatory Visit (INDEPENDENT_AMBULATORY_CARE_PROVIDER_SITE_OTHER): Payer: Medicare Other

## 2013-01-25 ENCOUNTER — Ambulatory Visit (AMBULATORY_SURGERY_CENTER): Payer: Self-pay

## 2013-01-25 ENCOUNTER — Telehealth: Payer: Self-pay | Admitting: Internal Medicine

## 2013-01-25 VITALS — Ht 72.0 in | Wt 220.0 lb

## 2013-01-25 DIAGNOSIS — E782 Mixed hyperlipidemia: Secondary | ICD-10-CM

## 2013-01-25 DIAGNOSIS — E039 Hypothyroidism, unspecified: Secondary | ICD-10-CM

## 2013-01-25 DIAGNOSIS — E559 Vitamin D deficiency, unspecified: Secondary | ICD-10-CM

## 2013-01-25 DIAGNOSIS — N320 Bladder-neck obstruction: Secondary | ICD-10-CM

## 2013-01-25 DIAGNOSIS — Z1211 Encounter for screening for malignant neoplasm of colon: Secondary | ICD-10-CM

## 2013-01-25 DIAGNOSIS — I10 Essential (primary) hypertension: Secondary | ICD-10-CM

## 2013-01-25 LAB — BASIC METABOLIC PANEL
BUN: 24 mg/dL — ABNORMAL HIGH (ref 6–23)
CO2: 29 mEq/L (ref 19–32)
Chloride: 101 mEq/L (ref 96–112)
Creatinine, Ser: 1.1 mg/dL (ref 0.4–1.5)

## 2013-01-25 LAB — CBC WITH DIFFERENTIAL/PLATELET
Basophils Absolute: 0.1 10*3/uL (ref 0.0–0.1)
Basophils Relative: 0.8 % (ref 0.0–3.0)
Eosinophils Absolute: 1.6 10*3/uL — ABNORMAL HIGH (ref 0.0–0.7)
Lymphocytes Relative: 28.9 % (ref 12.0–46.0)
MCHC: 34.5 g/dL (ref 30.0–36.0)
Neutrophils Relative %: 46.9 % (ref 43.0–77.0)
Platelets: 305 10*3/uL (ref 150.0–400.0)
RBC: 4.65 Mil/uL (ref 4.22–5.81)
WBC: 9.5 10*3/uL (ref 4.5–10.5)

## 2013-01-25 LAB — HEPATIC FUNCTION PANEL
ALT: 18 U/L (ref 0–53)
Bilirubin, Direct: 0.1 mg/dL (ref 0.0–0.3)
Total Protein: 7.3 g/dL (ref 6.0–8.3)

## 2013-01-25 LAB — LIPID PANEL: Cholesterol: 203 mg/dL — ABNORMAL HIGH (ref 0–200)

## 2013-01-25 LAB — TSH: TSH: 0.73 u[IU]/mL (ref 0.35–5.50)

## 2013-01-25 LAB — PSA: PSA: 0.67 ng/mL (ref 0.10–4.00)

## 2013-01-25 MED ORDER — MOVIPREP 100 G PO SOLR: 1.0000 | Freq: Once | ORAL | Status: DC

## 2013-01-25 NOTE — Telephone Encounter (Signed)
Medicaid wanted Medicare to pay for prep and Medicare wanted a generic.  Our MD's prefer to have brand-name only.  Free Moviprep voucher info given [facility].

## 2013-01-26 LAB — VITAMIN D 25 HYDROXY (VIT D DEFICIENCY, FRACTURES): Vit D, 25-Hydroxy: 47 ng/mL (ref 30–89)

## 2013-02-15 ENCOUNTER — Encounter: Payer: Medicare Other | Admitting: Internal Medicine

## 2013-02-22 ENCOUNTER — Encounter: Payer: Self-pay | Admitting: Internal Medicine

## 2013-02-22 ENCOUNTER — Ambulatory Visit (AMBULATORY_SURGERY_CENTER): Payer: Medicare Other | Admitting: Internal Medicine

## 2013-02-22 VITALS — BP 121/75 | HR 70 | Temp 98.7°F | Resp 45 | Ht 72.0 in | Wt 220.0 lb

## 2013-02-22 DIAGNOSIS — D126 Benign neoplasm of colon, unspecified: Secondary | ICD-10-CM

## 2013-02-22 DIAGNOSIS — Z1211 Encounter for screening for malignant neoplasm of colon: Secondary | ICD-10-CM | POA: Diagnosis not present

## 2013-02-22 MED ORDER — SODIUM CHLORIDE 0.9 % IV SOLN: 500.0000 mL | INTRAVENOUS | Status: DC

## 2013-02-22 NOTE — Op Note (Signed)
[facility]  [address]. [place], [address]   COLONOSCOPY PROCEDURE REPORT  PATIENT: Mark Booth, Mark Booth  MR#: [id number] BIRTHDATE: Mar 21, 1960 , 52  yrs. old GENDER: Male ENDOSCOPIST: Eustace Quail, MD REFERRED XL:[id number] Lenna Gilford, M.D. PROCEDURE DATE:  02/22/2013 PROCEDURE:   Colonoscopy with snare polypectomy x 2 First Screening Colonoscopy - Avg.  risk and is 50 yrs.  old or older Yes.  Prior Negative Screening - Now for repeat screening. N/A  History of Adenoma - Now for follow-up colonoscopy & has been > or = to 3 yrs.  N/A  Polyps Removed Today? Yes. ASA CLASS:   Class II INDICATIONS:average risk screening. MEDICATIONS: MAC sedation, administered by CRNA and propofol (Diprivan) 400mg  IV  DESCRIPTION OF PROCEDURE:   After the risks benefits and alternatives of the procedure were thoroughly explained, informed consent was obtained.  A digital rectal exam revealed no abnormalities of the rectum.   The LB [id number]  endoscope was introduced through the anus and advanced to the cecum, which was identified by both the appendix and ileocecal valve. No adverse events experienced.   The quality of the prep was good, using MoviPrep  The instrument was then slowly withdrawn as the colon was fully examined.    COLON FINDINGS: Two diminutive polyps were found in the ascending colon and transverse colon.  A polypectomy was performed with a cold snare.  The resection was complete and the polyp tissue was completely retrieved.   The colon mucosa was otherwise normal. Retroflexed views revealed no abnormalities. The time to cecum=2 minutes 36 seconds.  Withdrawal time=15 minutes 11 seconds.  The scope was withdrawn and the procedure completed. COMPLICATIONS: There were no complications.  ENDOSCOPIC IMPRESSION: 1.   Two diminutive polyps were found in the colon; polypectomy was performed with a cold snare 2.   The colon mucosa was otherwise  normal  RECOMMENDATIONS: 1. Repeat colonoscopy in 5 years if polyp adenomatous; otherwise 10 years   eSigned:  Eustace Quail, MD 02/22/2013 10:48 AM   cc: Noralee Space, MD and The Patient

## 2013-02-22 NOTE — Progress Notes (Signed)
Called to room to assist during endoscopic procedure.  Patient ID and intended procedure confirmed with present staff. Received instructions for my participation in the procedure from the performing physician.  

## 2013-02-22 NOTE — Patient Instructions (Signed)
Discharge instructions given with verbal understanding. Handout on polyps. Resume previous medications. YOU HAD AN ENDOSCOPIC PROCEDURE TODAY AT THE [facility]: Refer to the procedure report that was given to you for any specific questions about what was found during the examination.  If the procedure report does not answer your questions, please call your gastroenterologist to clarify.  If you requested that your care partner not be given the details of your procedure findings, then the procedure report has been included in a sealed envelope for you to review at your convenience later.  YOU SHOULD EXPECT: Some feelings of bloating in the abdomen. Passage of more gas than usual.  Walking can help get rid of the air that was put into your GI tract during the procedure and reduce the bloating. If you had a lower endoscopy (such as a colonoscopy or flexible sigmoidoscopy) you may notice spotting of blood in your stool or on the toilet paper. If you underwent a bowel prep for your procedure, then you may not have a normal bowel movement for a few days.  DIET: Your first meal following the procedure should be a light meal and then it is ok to progress to your normal diet.  A half-sandwich or bowl of soup is an example of a good first meal.  Heavy or fried foods are harder to digest and may make you feel nauseous or bloated.  Likewise meals heavy in dairy and vegetables can cause extra gas to form and this can also increase the bloating.  Drink plenty of fluids but you should avoid alcoholic beverages for 24 hours.  ACTIVITY: Your care partner should take you home directly after the procedure.  You should plan to take it easy, moving slowly for the rest of the day.  You can resume normal activity the day after the procedure however you should NOT DRIVE or use heavy machinery for 24 hours (because of the sedation medicines used during the test).    SYMPTOMS TO REPORT IMMEDIATELY: A  gastroenterologist can be reached at any hour.  During normal business hours, 8:30 AM to 5:00 PM Monday through Friday, call [phone].  After hours and on weekends, please call the GI answering service at [phone] who will take a message and have the physician on call contact you.   Following lower endoscopy (colonoscopy or flexible sigmoidoscopy):  Excessive amounts of blood in the stool  Significant tenderness or worsening of abdominal pains  Swelling of the abdomen that is new, acute  Fever of 100F or higher  FOLLOW UP: If any biopsies were taken you will be contacted by phone or by letter within the next 1-3 weeks.  Call your gastroenterologist if you have not heard about the biopsies in 3 weeks.  Our staff will call the home number listed on your records the next business day following your procedure to check on you and address any questions or concerns that you may have at that time regarding the information given to you following your procedure. This is a courtesy call and so if there is no answer at the home number and we have not heard from you through the emergency physician on call, we will assume that you have returned to your regular daily activities without incident.  SIGNATURES/CONFIDENTIALITY: You and/or your care partner have signed paperwork which will be entered into your electronic medical record.  These signatures attest to the fact that that the information above on your After Visit Summary has been   reviewed and is understood.  Full responsibility of the confidentiality of this discharge information lies with you and/or your care-partner. 

## 2013-02-22 NOTE — Progress Notes (Signed)
Report to pacu rn, vss, bbs=clear 

## 2013-02-22 NOTE — Progress Notes (Signed)
NO EGG OR SOY ALLERGY EWM NO PROBLEMS WITH PAST SEDATION. EWM 

## 2013-02-23 ENCOUNTER — Telehealth: Payer: Self-pay | Admitting: *Deleted

## 2013-02-23 NOTE — Telephone Encounter (Signed)
LM ON VM TO RETURN CALL IF QUESTIONS PROBLEMS OR CONCERNS. EWM

## 2013-02-24 ENCOUNTER — Other Ambulatory Visit: Payer: Self-pay | Admitting: Pulmonary Disease

## 2013-02-25 ENCOUNTER — Encounter: Payer: Self-pay | Admitting: Internal Medicine

## 2013-05-19 ENCOUNTER — Other Ambulatory Visit: Payer: Self-pay | Admitting: Pulmonary Disease

## 2013-05-19 MED ORDER — LEVOTHYROXINE SODIUM 150 MCG PO TABS: ORAL_TABLET | ORAL | Status: DC

## 2013-05-19 MED ORDER — SIMVASTATIN 40 MG PO TABS: ORAL_TABLET | ORAL | Status: DC

## 2013-05-19 MED ORDER — LISINOPRIL-HYDROCHLOROTHIAZIDE 10-12.5 MG PO TABS: ORAL_TABLET | ORAL | Status: DC

## 2013-05-24 ENCOUNTER — Telehealth: Payer: Self-pay | Admitting: Pulmonary Disease

## 2013-05-24 NOTE — Telephone Encounter (Signed)
Called spoke with pt. He is aware SN retired from [facility] 05/12/13. He wants to speak with his insurance and see who is in network and let us know,. Nothing further needed

## 2013-09-28 ENCOUNTER — Other Ambulatory Visit: Payer: Self-pay | Admitting: Pulmonary Disease

## 2013-10-25 ENCOUNTER — Other Ambulatory Visit: Payer: Self-pay | Admitting: Pulmonary Disease

## 2013-11-11 ENCOUNTER — Other Ambulatory Visit: Payer: Self-pay | Admitting: Pulmonary Disease

## 2013-12-08 ENCOUNTER — Other Ambulatory Visit: Payer: Self-pay | Admitting: Pulmonary Disease

## 2013-12-14 ENCOUNTER — Other Ambulatory Visit: Payer: Self-pay | Admitting: Pulmonary Disease

## 2013-12-31 ENCOUNTER — Other Ambulatory Visit: Payer: Self-pay | Admitting: Pulmonary Disease

## 2014-01-01 ENCOUNTER — Other Ambulatory Visit: Payer: Self-pay | Admitting: Pulmonary Disease

## 2014-01-19 ENCOUNTER — Emergency Department (HOSPITAL_COMMUNITY): Payer: Medicare Other

## 2014-01-19 ENCOUNTER — Emergency Department (HOSPITAL_COMMUNITY)
Admission: EM | Admit: 2014-01-19 | Discharge: 2014-01-19 | Disposition: A | Discharge status: Home / Self Care | Payer: Medicare Other | Attending: Emergency Medicine | Admitting: Emergency Medicine

## 2014-01-19 ENCOUNTER — Encounter (HOSPITAL_COMMUNITY): Payer: Self-pay | Admitting: Emergency Medicine

## 2014-01-19 DIAGNOSIS — Z8739 Personal history of other diseases of the musculoskeletal system and connective tissue: Secondary | ICD-10-CM | POA: Insufficient documentation

## 2014-01-19 DIAGNOSIS — Z79899 Other long term (current) drug therapy: Secondary | ICD-10-CM | POA: Diagnosis not present

## 2014-01-19 DIAGNOSIS — S8991XA Unspecified injury of right lower leg, initial encounter: Secondary | ICD-10-CM | POA: Insufficient documentation

## 2014-01-19 DIAGNOSIS — Y998 Other external cause status: Secondary | ICD-10-CM | POA: Insufficient documentation

## 2014-01-19 DIAGNOSIS — Y9389 Activity, other specified: Secondary | ICD-10-CM | POA: Insufficient documentation

## 2014-01-19 DIAGNOSIS — I10 Essential (primary) hypertension: Secondary | ICD-10-CM | POA: Insufficient documentation

## 2014-01-19 DIAGNOSIS — E039 Hypothyroidism, unspecified: Secondary | ICD-10-CM | POA: Insufficient documentation

## 2014-01-19 DIAGNOSIS — F319 Bipolar disorder, unspecified: Secondary | ICD-10-CM | POA: Diagnosis not present

## 2014-01-19 DIAGNOSIS — J45909 Unspecified asthma, uncomplicated: Secondary | ICD-10-CM | POA: Diagnosis not present

## 2014-01-19 DIAGNOSIS — M542 Cervicalgia: Secondary | ICD-10-CM

## 2014-01-19 DIAGNOSIS — E663 Overweight: Secondary | ICD-10-CM | POA: Diagnosis not present

## 2014-01-19 DIAGNOSIS — S199XXA Unspecified injury of neck, initial encounter: Secondary | ICD-10-CM | POA: Insufficient documentation

## 2014-01-19 DIAGNOSIS — Y92480 Sidewalk as the place of occurrence of the external cause: Secondary | ICD-10-CM | POA: Insufficient documentation

## 2014-01-19 DIAGNOSIS — M25561 Pain in right knee: Secondary | ICD-10-CM

## 2014-01-19 DIAGNOSIS — R52 Pain, unspecified: Secondary | ICD-10-CM

## 2014-01-19 MED ORDER — IBUPROFEN 800 MG PO TABS
800.0000 mg | ORAL_TABLET | Freq: Once | ORAL | Status: AC
  Administered 2014-01-19: 800 mg via ORAL
  Filled 2014-01-19: qty 1

## 2014-01-19 MED ORDER — HYDROCODONE-ACETAMINOPHEN 5-325 MG PO TABS: 1.0000 | ORAL_TABLET | ORAL | Status: DC | PRN

## 2014-01-19 MED ORDER — DIAZEPAM 5 MG PO TABS: 5.0000 mg | ORAL_TABLET | Freq: Three times a day (TID) | ORAL | Status: DC | PRN

## 2014-01-19 NOTE — ED Notes (Addendum)
Pt is refusing CT scan. PA aware
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
## 2014-01-19 NOTE — ED Notes (Signed)
Called for patient to fast track but no answer in the lobby
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
## 2014-01-19 NOTE — Discharge Instructions (Signed)
Read the information below.  Use the prescribed medication as directed.  Please discuss all new medications with your pharmacist.  Do not take additional tylenol while taking the prescribed pain medication to avoid overdose.  You may return to the Emergency Department at any time for worsening condition or any new symptoms that concern you.  If you develop uncontrolled pain, weakness or numbness of the extremity, severe discoloration of the skin, or you are unable to walk, return to the ER for a recheck.    If you develop fevers, loss of control of bowel or bladder, weakness or numbness in your legs, or are unable to walk, return to the ER for a recheck.      Bicycling, Adult Cyclists  Whether motivated by recreation or transportation, more adults are taking up cycling than ever before. Learning more about cycling greatly increases confidence. And it can be a great aid in learning to share the road more effectively.  If you are using your bicycle in different situations than you previously have, such as switching from occasional short recreational rides to regularly commuting to work, you may want to take a short workshop. Begin by assessing yourself: How confident are you in your cycling skills? What would you like to know more about? Are there particular kinds of cycling you would like to try out? Courses and workshops may focus on learning to race, long distance touring, teaching children to cycle safely, commuting, or bike repairs. With that in mind, adult cyclists may wish to check around their community for bike clubs, classes, rides, and other cycling opportunities. Check with the League of American Bicyclists (www.bikeleague.org) for a listing of instructional opportunities available in your area.  Brush up on riding skills and rules if it has been a while since you cycled regularly.  Adult cyclists who wish to cycle with small children, and cyclists needing to transport cargo, should investigate  the various child seats and trailers available. Determine which are the safest and which will work best for you.  Adult cyclists should learn more about off-road cycling, touring, and racing before participating in these activities. Adult cyclists are encouraged to try cycling on multi-use paths. Remember to respect others' needs on the trails.  Do not underestimate the importance of wearing a helmet. Accidents can happen anywhere. Cyclists should always wear a helmet.  Adult cyclists should learn how to handle harassment from motorists and others in traffic. It is in your best interest not to return any harassment or insults.  Just like a car, a bicycle requires basic maintenance to keep running smoothly and safely. Bikes are easy to work on and you can save money by learning bike maintenance. This can be done by picking up a manual and taking a repair course. Those who really do not have time should keep their bicycles regularly serviced at a good bike shop.  Bicycling can fit into one's everyday life. Substitute a bike ride for a car trip. Adult cyclists should know the health and environmental benefits of bicycling. Document Released: 04/20/2003 Document Revised: 06/14/2013 Document Reviewed: 01/25/2008 [facility] Patient Information 2015 [place], [place]. This information is not intended to replace advice given to you by your health care provider. Make sure you discuss any questions you have with your health care provider.
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
## 2014-02-22 ENCOUNTER — Other Ambulatory Visit: Payer: Self-pay | Admitting: Geriatric Medicine

## 2014-02-22 ENCOUNTER — Telehealth: Payer: Self-pay | Admitting: Internal Medicine

## 2014-02-22 MED ORDER — FENOFIBRATE 160 MG PO TABS
160.0000 mg | ORAL_TABLET | Freq: Every day | ORAL | Status: DC
Start: 2014-02-22 — End: 2014-04-06

## 2014-02-22 MED ORDER — LISINOPRIL-HYDROCHLOROTHIAZIDE 10-12.5 MG PO TABS: 1.0000 | ORAL_TABLET | Freq: Every day | ORAL | Status: DC

## 2014-02-22 NOTE — Telephone Encounter (Signed)
Sent to pharmacy 

## 2014-02-22 NOTE — Telephone Encounter (Signed)
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  He is establishing care at the end of March.  He is currently out of lisinopril and almost out of fenofibrate.  He is requesting refill on these two scripts to be sent to [facility] on [address] to get him through until that appointment time.  Please advise.

## 2014-03-08 ENCOUNTER — Ambulatory Visit: Payer: Medicare Other | Admitting: Family

## 2014-03-30 ENCOUNTER — Other Ambulatory Visit: Payer: Self-pay | Admitting: Internal Medicine

## 2014-04-06 MED ORDER — FENOFIBRATE 160 MG PO TABS: 160.0000 mg | ORAL_TABLET | Freq: Every day | ORAL | Status: DC

## 2014-04-06 NOTE — Telephone Encounter (Signed)
Pt called in again for another refill of lisinopril and fenofibrate.  He is establishing on 3/21

## 2014-04-06 NOTE — Telephone Encounter (Signed)
Sent 30 day until appt../lmb 

## 2014-05-02 ENCOUNTER — Ambulatory Visit: Payer: Medicare Other | Admitting: Internal Medicine

## 2014-05-12 ENCOUNTER — Other Ambulatory Visit (INDEPENDENT_AMBULATORY_CARE_PROVIDER_SITE_OTHER): Payer: Medicare Other

## 2014-05-12 ENCOUNTER — Other Ambulatory Visit: Payer: Self-pay | Admitting: Geriatric Medicine

## 2014-05-12 ENCOUNTER — Telehealth: Payer: Self-pay | Admitting: Internal Medicine

## 2014-05-12 ENCOUNTER — Encounter: Payer: Self-pay | Admitting: Internal Medicine

## 2014-05-12 ENCOUNTER — Ambulatory Visit (INDEPENDENT_AMBULATORY_CARE_PROVIDER_SITE_OTHER): Payer: Medicare Other | Admitting: Internal Medicine

## 2014-05-12 VITALS — BP 148/90 | HR 85 | Temp 98.1°F | Resp 18 | Ht 72.0 in | Wt 230.4 lb

## 2014-05-12 DIAGNOSIS — Z79899 Other long term (current) drug therapy: Secondary | ICD-10-CM

## 2014-05-12 DIAGNOSIS — R351 Nocturia: Secondary | ICD-10-CM | POA: Diagnosis not present

## 2014-05-12 DIAGNOSIS — D500 Iron deficiency anemia secondary to blood loss (chronic): Secondary | ICD-10-CM

## 2014-05-12 DIAGNOSIS — F1010 Alcohol abuse, uncomplicated: Secondary | ICD-10-CM

## 2014-05-12 DIAGNOSIS — D649 Anemia, unspecified: Secondary | ICD-10-CM | POA: Diagnosis not present

## 2014-05-12 DIAGNOSIS — R21 Rash and other nonspecific skin eruption: Secondary | ICD-10-CM

## 2014-05-12 DIAGNOSIS — R7301 Impaired fasting glucose: Secondary | ICD-10-CM

## 2014-05-12 DIAGNOSIS — R5383 Other fatigue: Secondary | ICD-10-CM

## 2014-05-12 DIAGNOSIS — E782 Mixed hyperlipidemia: Secondary | ICD-10-CM

## 2014-05-12 DIAGNOSIS — E785 Hyperlipidemia, unspecified: Secondary | ICD-10-CM | POA: Diagnosis not present

## 2014-05-12 DIAGNOSIS — Z0000 Encounter for general adult medical examination without abnormal findings: Secondary | ICD-10-CM

## 2014-05-12 DIAGNOSIS — F319 Bipolar disorder, unspecified: Secondary | ICD-10-CM

## 2014-05-12 DIAGNOSIS — E669 Obesity, unspecified: Secondary | ICD-10-CM

## 2014-05-12 DIAGNOSIS — E039 Hypothyroidism, unspecified: Secondary | ICD-10-CM

## 2014-05-12 DIAGNOSIS — I10 Essential (primary) hypertension: Secondary | ICD-10-CM

## 2014-05-12 LAB — LIPID PANEL
CHOL/HDL RATIO: 4
Cholesterol: 220 mg/dL — ABNORMAL HIGH (ref 0–200)
HDL: 55.8 mg/dL (ref >39.00)
LDL Cholesterol: 133 mg/dL — ABNORMAL HIGH (ref 0–99)
NONHDL: 164.2
Triglycerides: 157 mg/dL — ABNORMAL HIGH (ref 0.0–149.0)
VLDL: 31.4 mg/dL (ref 0.0–40.0)

## 2014-05-12 LAB — COMPREHENSIVE METABOLIC PANEL
ALT: 14 U/L (ref 0–53)
AST: 21 U/L (ref 0–37)
Albumin: 4.4 g/dL (ref 3.5–5.2)
Alkaline Phosphatase: 57 U/L (ref 39–117)
BILIRUBIN TOTAL: 0.5 mg/dL (ref 0.2–1.2)
BUN: 16 mg/dL (ref 6–23)
CO2: 27 meq/L (ref 19–32)
Calcium: 9.6 mg/dL (ref 8.4–10.5)
Chloride: 110 mEq/L (ref 96–112)
Creatinine, Ser: 1.08 mg/dL (ref 0.40–1.50)
GFR: 76.29 mL/min (ref >60.00)
Glucose, Bld: 104 mg/dL — ABNORMAL HIGH (ref 70–99)
Potassium: 4.5 mEq/L (ref 3.5–5.1)
Sodium: 143 mEq/L (ref 135–145)
Total Protein: 7 g/dL (ref 6.0–8.3)

## 2014-05-12 LAB — HEMOGLOBIN A1C: HEMOGLOBIN A1C: 6 % (ref 4.6–6.5)

## 2014-05-12 LAB — FOLATE: Folate: >23.3 ng/mL (ref >5.9)

## 2014-05-12 LAB — VITAMIN B12: VITAMIN B 12: 116 pg/mL — AB (ref 211–911)

## 2014-05-12 LAB — PSA: PSA: 0.74 ng/mL (ref 0.10–4.00)

## 2014-05-12 LAB — HIV ANTIBODY (ROUTINE TESTING W REFLEX): HIV: NONREACTIVE

## 2014-05-12 MED ORDER — ALBUTEROL SULFATE HFA 108 (90 BASE) MCG/ACT IN AERS: INHALATION_SPRAY | RESPIRATORY_TRACT | Status: DC

## 2014-05-12 MED ORDER — SIMVASTATIN 40 MG PO TABS: 40.0000 mg | ORAL_TABLET | Freq: Every day | ORAL | Status: DC

## 2014-05-12 MED ORDER — FENOFIBRATE 160 MG PO TABS: 160.0000 mg | ORAL_TABLET | Freq: Every day | ORAL | Status: DC

## 2014-05-12 MED ORDER — LISINOPRIL-HYDROCHLOROTHIAZIDE 10-12.5 MG PO TABS: ORAL_TABLET | ORAL | Status: DC

## 2014-05-12 NOTE — Patient Instructions (Addendum)
We will check all your blood work today and call you back about the results.   The best thing you can do for your health is to limit or stop drinking. The most a man should drink a day is 1-2 servings of alcohol. This would be one glass of wine or one beer (each would count as 1 serving of alcohol). You should be drinking 1 serving most nights at most and sometimes 2 drinks. Any more can cause permanent damage to the brain and to the liver.   How Much is Too Much Alcohol? Drinking too much alcohol can cause injury, accidents, and health problems. These types of problems can include:   Car crashes.  Falls.  Family fighting (domestic violence).  Drowning.  Fights.  Injuries.  Burns.  Damage to certain organs.  Having a baby with birth defects. ONE DRINK CAN BE TOO MUCH WHEN YOU ARE:  Working.  Pregnant or breastfeeding.  Taking medicines. Ask your doctor.  Driving or planning to drive. WHAT IS A STANDARD DRINK?   1 regular beer (12 ounces or 360 milliliters).  1 glass of wine (5 ounces or 150 milliliters).  1 shot of liquor (1.5 ounces or 45 milliliters). BLOOD ALCOHOL LEVELS   .00 A person is sober.  Marland Kitchen03 A person has no trouble keeping balance, talking, or seeing right, but a "buzz" may be felt.  Marland Kitchen05 A person feels "buzzed" and relaxed.  Marland Kitchen08 or .10  A person is drunk. He or she has trouble talking, seeing right, and keeping his or her balance.  .15 A person loses body control and may pass out (blackout).  .20 A person has trouble walking (staggering) and throws up (vomits).  .30 A person will pass out (unconscious).  .40+ A person will be in a coma. Death is possible. If you or someone you know has a drinking problem, get help from a doctor.  Document Released: 11/24/2008 Document Revised: 04/22/2011 Document Reviewed: 11/24/2008 [facility] Patient Information 2015 [place], [place]. This information is not intended to replace advice given to you by your health  care provider. Make sure you discuss any questions you have with your health care provider.

## 2014-05-12 NOTE — Telephone Encounter (Signed)
Patient is requesting fenofibrate 160 mg, lisinopril hydrochlorothiazide 10-12.5 mg, proair hfa 108 90 base, and simvastatatin 40mg  to be sent to [facility] on [address]

## 2014-05-12 NOTE — Progress Notes (Signed)
Pre visit review using our clinic review tool, if applicable. No additional management support is needed unless otherwise documented below in the visit note. 

## 2014-05-12 NOTE — Telephone Encounter (Signed)
Sent to pharmacy 

## 2014-05-13 DIAGNOSIS — F1010 Alcohol abuse, uncomplicated: Secondary | ICD-10-CM | POA: Insufficient documentation

## 2014-05-13 NOTE — Progress Notes (Signed)
   Subjective:    Patient ID: Mark Booth, male    DOB: 1960/10/10, 54 y.o.   MRN: [id number]  HPI The patient is a 54 YO man who is here to follow on his known medical problems including his hypertension (not complicated, mildly elevated, on lisinopril/hctz), his hyperlipidemia (stable, no complications, on fenofibrate), and his weight (he is overweight and gaining more weight recently, has had borderline to high sugars in the past, his mental health medications make him gain weight). The other concern that he has is that he is drinking more alcohol lately as his mental health is not well controlled right now. He is drinking 2-3 bottles of wine per 2 days. He is also drinking beer some nights (not both on one night) and can drink a 6 pack in a night. He lives alone and does not drive while drinking. He has blacked out and awoken on the floor. He has drank in the morning because he was not feeling well. He admits he is an alcoholic but does not want to quit altogether as he enjoys drinking so much. It is one of his only pleasures.   PMH, [facility], social history reviewed and updated with the patient at visit.   Review of Systems  Constitutional: Positive for unexpected weight change. Negative for chills, activity change, appetite change and fatigue.       Gained weight  HENT: Negative.   Eyes: Negative.   Respiratory: Negative for cough, chest tightness, shortness of breath and wheezing.   Cardiovascular: Negative for chest pain, palpitations and leg swelling.  Gastrointestinal: Negative.   Musculoskeletal: Negative.   Skin: Negative.   Neurological: Negative.   Psychiatric/Behavioral: Positive for behavioral problems, sleep disturbance, dysphoric mood and decreased concentration. Negative for suicidal ideas and self-injury. The patient is nervous/anxious.       Objective:   Physical Exam  Constitutional: He is oriented to person, place, and time. He appears well-developed and well-nourished.    Overweight  HENT:  Head: Normocephalic and atraumatic.  Eyes: EOM are normal.  Neck: Normal range of motion.  Cardiovascular: Normal rate and regular rhythm.   Pulmonary/Chest: Effort normal and breath sounds normal. No respiratory distress. He has no wheezes. He has no rales.  Abdominal: Soft.  Musculoskeletal: He exhibits no edema.  Neurological: He is alert and oriented to person, place, and time. Coordination normal.  Skin: Skin is warm and dry.  Psychiatric:  Mood flat   Filed Vitals:   05/12/14 1303  BP: 148/90  Pulse: 85  Temp: 98.1 F (36.7 C)  TempSrc: Oral  Resp: 18  Height: 6' (1.829 m)  Weight: 230 lb 6.4 oz (104.509 kg)  SpO2: 95%      Assessment & Plan:

## 2014-05-13 NOTE — Assessment & Plan Note (Signed)
Need to check lipid panel, CMP, HgA1c to make sure no complications of his weight. He is at high risk for metabolic syndrome from his concurrent mental health treatment.

## 2014-05-13 NOTE — Assessment & Plan Note (Signed)
This is not well controlled right now and his substance abuse of alcohol is likely worsening his depression. He will return to mental health for regimen adjustment as he knows this is a problem that needs fixing. He denies SI or HI.

## 2014-05-13 NOTE — Assessment & Plan Note (Signed)
He is drinking excessively and talked to him about the fact that alcohol is a depressant and although makes him feel less depressed while drunk overall is likely a significant contributor to his poor overall mental health. Talked to him about the need to quit altogether but he has no intention of doing that. Asked him to limit to 2 drinks per day to avoid serious risk to his health. He is not sure he can do this but he will try to cut back some.

## 2014-05-13 NOTE — Assessment & Plan Note (Signed)
Checking thyroid labs today and will adjust medication as indicated. Currently taking synthroid 150 mcg and unclear if he is having some symptoms with his concomitant depression.

## 2014-05-13 NOTE — Assessment & Plan Note (Signed)
Checking lipid panel, currently on statin and stable without side effects. Adjust as needed based on lab results.

## 2014-05-13 NOTE — Assessment & Plan Note (Signed)
BP mildly elevated and likely the excessive amounts of alcohol are contributing. He does not check his BP at home. Have asked him to limit alcohol to see if this will bring him to goal. If no improvement will need change to his regimen at next visit.

## 2014-07-22 ENCOUNTER — Telehealth: Payer: Self-pay | Admitting: Internal Medicine

## 2014-07-22 NOTE — Telephone Encounter (Signed)
Please call patient regarding his lab work that was done on 3/31

## 2014-08-02 ENCOUNTER — Other Ambulatory Visit: Payer: Self-pay | Admitting: Pulmonary Disease

## 2014-08-06 ENCOUNTER — Other Ambulatory Visit: Payer: Self-pay | Admitting: Pulmonary Disease

## 2014-08-09 ENCOUNTER — Other Ambulatory Visit: Payer: Self-pay | Admitting: Geriatric Medicine

## 2014-08-09 ENCOUNTER — Telehealth: Payer: Self-pay | Admitting: Internal Medicine

## 2014-08-09 MED ORDER — LEVOTHYROXINE SODIUM 150 MCG PO TABS: ORAL_TABLET | ORAL | Status: DC

## 2014-08-09 NOTE — Telephone Encounter (Signed)
Patient called in regarding his lab work that was done a few months ago. He went on MyChart and the thyroxine level was not on there and he is concerned it was not done.  Please call patient at [phone] to discuss this with him

## 2014-08-10 ENCOUNTER — Telehealth: Payer: Self-pay | Admitting: Geriatric Medicine

## 2014-08-10 ENCOUNTER — Telehealth: Payer: Self-pay | Admitting: Internal Medicine

## 2014-08-10 NOTE — Telephone Encounter (Signed)
Spoke with patient.

## 2014-08-10 NOTE — Telephone Encounter (Signed)
Patient called asking about his labs that were done back in March. He wanted to know if a thyroxine test had been done because he did not see it when he reviewed his results. 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 I spoke with Dr. Doug Sou and she said that since the labs were done back in March that it would be a good idea for the patient to come back in and discuss this with her and she will order the test if necessary. The patient refused and said that Dr. Doug Sou is full of it and he wants to speak to her boss. I explained that Dr. Doug Sou has no problem ordering the test if necessary, he just needs to come in and discuss his reasoning as to why he needs the test. He was very rude and mad. He was cursing and yelling on the phone.

## 2014-08-10 NOTE — Telephone Encounter (Signed)
Patient called to schedule appointment.  Advised that with Medicaid, we would need a referral from the patients PCP for scheduling.  Pt stated that he had personally removed that stipulation and that a referral was no longer needed.  Advised that we still needed a referral for scheduling.  Pt stated that he was going to go elsewhere for GI needs.

## 2014-08-11 NOTE — Telephone Encounter (Signed)
Patient called to speak with Practice Administrator.  Does not understand why he needs to come in for an appointment before having a thyroxine level checked.  Says he has always had labs done every 3 months.  I told him that he needed to come in for the appointment but he was adamant that he did not need to do that.  Please advise.

## 2014-08-11 NOTE — Telephone Encounter (Signed)
He is correct that he does need his thyroid levels checked and it is was not done in March. At this time due to insurance he would need visit prior to lab. Cannot just order a test at this time. If he had called to let us know closer to when labs were done it could have been added.

## 2014-11-11 ENCOUNTER — Ambulatory Visit: Payer: Medicare Other | Admitting: Internal Medicine

## 2015-01-13 ENCOUNTER — Other Ambulatory Visit: Payer: Self-pay | Admitting: Internal Medicine

## 2015-01-17 ENCOUNTER — Telehealth: Payer: Self-pay | Admitting: Internal Medicine

## 2015-01-17 NOTE — Telephone Encounter (Signed)
I'm not familiar with this patient. He should go to emergency room if he cannot swallow.

## 2015-01-17 NOTE — Telephone Encounter (Signed)
Pts mother called and states that the pt cannot swallow any food, only liquids. She states this happened about 2-3 days ago and this is the longest it has lasted. Reports he has not had an EGD done, states usually he has had to go to the ER and they make him "wait until he coughs up whatever is in his throat." There are no APP appts available. Please advise.

## 2015-01-18 NOTE — Telephone Encounter (Signed)
Spoke with pts mother and she is aware. 

## 2015-02-17 ENCOUNTER — Other Ambulatory Visit: Payer: Self-pay | Admitting: Internal Medicine

## 2015-03-26 ENCOUNTER — Other Ambulatory Visit: Payer: Self-pay | Admitting: Internal Medicine

## 2015-04-28 ENCOUNTER — Other Ambulatory Visit: Payer: Self-pay | Admitting: Internal Medicine
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
## 2015-05-28 ENCOUNTER — Other Ambulatory Visit: Payer: Self-pay | Admitting: Internal Medicine

## 2015-06-27 ENCOUNTER — Other Ambulatory Visit: Payer: Self-pay | Admitting: Internal Medicine

## 2015-07-26 ENCOUNTER — Other Ambulatory Visit: Payer: Self-pay | Admitting: Internal Medicine

## 2015-08-03 ENCOUNTER — Other Ambulatory Visit: Payer: Self-pay | Admitting: Internal Medicine

## 2015-08-22 ENCOUNTER — Other Ambulatory Visit: Payer: Self-pay | Admitting: Internal Medicine

## 2015-08-28 ENCOUNTER — Other Ambulatory Visit: Payer: Self-pay | Admitting: *Deleted

## 2015-08-28 MED ORDER — SIMVASTATIN 40 MG PO TABS: 40.0000 mg | ORAL_TABLET | Freq: Every day | ORAL | Status: DC

## 2015-09-04 ENCOUNTER — Other Ambulatory Visit: Payer: Self-pay | Admitting: Internal Medicine

## 2015-10-23 ENCOUNTER — Other Ambulatory Visit: Payer: Self-pay | Admitting: Internal Medicine

## 2015-11-03 ENCOUNTER — Other Ambulatory Visit: Payer: Self-pay | Admitting: Internal Medicine

## 2016-01-07 ENCOUNTER — Other Ambulatory Visit: Payer: Self-pay | Admitting: Internal Medicine

## 2016-02-08 ENCOUNTER — Other Ambulatory Visit: Payer: Self-pay | Admitting: Internal Medicine

## 2016-03-18 ENCOUNTER — Other Ambulatory Visit: Payer: Self-pay | Admitting: Internal Medicine

## 2016-04-01 ENCOUNTER — Other Ambulatory Visit: Payer: Self-pay | Admitting: Internal Medicine

## 2016-07-21 ENCOUNTER — Other Ambulatory Visit: Payer: Self-pay | Admitting: Internal Medicine

## 2016-07-28 ENCOUNTER — Other Ambulatory Visit: Payer: Self-pay | Admitting: Internal Medicine

## 2016-07-28 DIAGNOSIS — E039 Hypothyroidism, unspecified: Secondary | ICD-10-CM

## 2016-07-29 NOTE — Telephone Encounter (Signed)
Last tsh was 2014---routing to greg, are you ok with refilling in dr crawfords absence---please advise, thanks

## 2016-07-29 NOTE — Telephone Encounter (Signed)
Please have him obtain TSH. 30 day supply provided with no refills until TSH completed.

## 2018-01-20 ENCOUNTER — Ambulatory Visit: Payer: Medicare Other | Admitting: Internal Medicine

## 2018-01-26 ENCOUNTER — Ambulatory Visit (INDEPENDENT_AMBULATORY_CARE_PROVIDER_SITE_OTHER): Payer: Medicare Other | Admitting: Internal Medicine

## 2018-01-26 ENCOUNTER — Other Ambulatory Visit (INDEPENDENT_AMBULATORY_CARE_PROVIDER_SITE_OTHER): Payer: Medicare Other

## 2018-01-26 ENCOUNTER — Encounter: Payer: Self-pay | Admitting: Internal Medicine

## 2018-01-26 VITALS — BP 160/92 | HR 90 | Temp 97.7°F | Ht 72.0 in | Wt 246.0 lb

## 2018-01-26 DIAGNOSIS — R7301 Impaired fasting glucose: Secondary | ICD-10-CM

## 2018-01-26 DIAGNOSIS — I10 Essential (primary) hypertension: Secondary | ICD-10-CM

## 2018-01-26 DIAGNOSIS — F1010 Alcohol abuse, uncomplicated: Secondary | ICD-10-CM

## 2018-01-26 DIAGNOSIS — E782 Mixed hyperlipidemia: Secondary | ICD-10-CM

## 2018-01-26 DIAGNOSIS — Z6833 Body mass index (BMI) 33.0-33.9, adult: Secondary | ICD-10-CM

## 2018-01-26 DIAGNOSIS — E039 Hypothyroidism, unspecified: Secondary | ICD-10-CM

## 2018-01-26 DIAGNOSIS — E6609 Other obesity due to excess calories: Secondary | ICD-10-CM

## 2018-01-26 DIAGNOSIS — E559 Vitamin D deficiency, unspecified: Secondary | ICD-10-CM

## 2018-01-26 DIAGNOSIS — J4521 Mild intermittent asthma with (acute) exacerbation: Secondary | ICD-10-CM

## 2018-01-26 DIAGNOSIS — E538 Deficiency of other specified B group vitamins: Secondary | ICD-10-CM

## 2018-01-26 DIAGNOSIS — R0602 Shortness of breath: Secondary | ICD-10-CM | POA: Insufficient documentation

## 2018-01-26 LAB — COMPREHENSIVE METABOLIC PANEL
ALK PHOS: 48 U/L (ref 39–117)
ALT: 16 U/L (ref 0–53)
AST: 22 U/L (ref 0–37)
Albumin: 4.6 g/dL (ref 3.5–5.2)
BUN: 24 mg/dL — ABNORMAL HIGH (ref 6–23)
CALCIUM: 9.6 mg/dL (ref 8.4–10.5)
CO2: 26 mEq/L (ref 19–32)
Chloride: 103 mEq/L (ref 96–112)
Creatinine, Ser: 1.04 mg/dL (ref 0.40–1.50)
GFR: 78.58 mL/min (ref >60.00)
Glucose, Bld: 99 mg/dL (ref 70–99)
Potassium: 3.8 mEq/L (ref 3.5–5.1)
Sodium: 139 mEq/L (ref 135–145)
TOTAL PROTEIN: 7.5 g/dL (ref 6.0–8.3)
Total Bilirubin: 0.4 mg/dL (ref 0.2–1.2)

## 2018-01-26 LAB — POCT EXHALED NITRIC OXIDE: FeNO level (ppb): 52

## 2018-01-26 LAB — LDL CHOLESTEROL, DIRECT: Direct LDL: 134 mg/dL

## 2018-01-26 LAB — BRAIN NATRIURETIC PEPTIDE: Pro B Natriuretic peptide (BNP): 60 pg/mL (ref 0.0–100.0)

## 2018-01-26 LAB — VITAMIN D 25 HYDROXY (VIT D DEFICIENCY, FRACTURES): VITD: 24.55 ng/mL — ABNORMAL LOW (ref 30.00–100.00)

## 2018-01-26 LAB — TSH: TSH: 2.38 u[IU]/mL (ref 0.35–4.50)

## 2018-01-26 LAB — CBC
HCT: 39.2 % (ref 39.0–52.0)
Hemoglobin: 13.7 g/dL (ref 13.0–17.0)
MCHC: 34.9 g/dL (ref 30.0–36.0)
MCV: 90.4 fl (ref 78.0–100.0)
Platelets: 275 10*3/uL (ref 150.0–400.0)
RBC: 4.33 Mil/uL (ref 4.22–5.81)
RDW: 12.9 % (ref 11.5–15.5)
WBC: 8.8 10*3/uL (ref 4.0–10.5)

## 2018-01-26 LAB — HEMOGLOBIN A1C: Hgb A1c MFr Bld: 5.6 % (ref 4.6–6.5)

## 2018-01-26 LAB — LIPID PANEL
Cholesterol: 234 mg/dL — ABNORMAL HIGH (ref 0–200)
HDL: 73.6 mg/dL (ref >39.00)
NonHDL: 160.8
TRIGLYCERIDES: 242 mg/dL — AB (ref 0.0–149.0)
Total CHOL/HDL Ratio: 3
VLDL: 48.4 mg/dL — ABNORMAL HIGH (ref 0.0–40.0)

## 2018-01-26 LAB — T4, FREE: Free T4: 0.88 ng/dL (ref 0.60–1.60)

## 2018-01-26 LAB — VITAMIN B12: Vitamin B-12: 202 pg/mL — ABNORMAL LOW (ref 211–911)

## 2018-01-26 MED ORDER — FLUTICASONE-SALMETEROL 100-50 MCG/DOSE IN AEPB: 1.0000 | INHALATION_SPRAY | Freq: Two times a day (BID) | RESPIRATORY_TRACT | 3 refills | Status: DC

## 2018-01-26 MED ORDER — PREDNISONE 20 MG PO TABS: 40.0000 mg | ORAL_TABLET | Freq: Every day | ORAL | 0 refills | Status: DC

## 2018-01-26 MED ORDER — ALBUTEROL SULFATE HFA 108 (90 BASE) MCG/ACT IN AERS: INHALATION_SPRAY | RESPIRATORY_TRACT | 0 refills | Status: DC

## 2018-01-26 NOTE — Assessment & Plan Note (Signed)
Per patient weight is up about 50 pounds recently due to alcohol and food intake.

## 2018-01-26 NOTE — Assessment & Plan Note (Signed)
Checking TSH and free T4 and adjust synthroid 150 mcg daily if needed.

## 2018-01-26 NOTE — Assessment & Plan Note (Signed)
Still drinking about the same amount 2 bottles wine daily and no desire to decrease or quit. We talked about how this could be affecting his health.

## 2018-01-26 NOTE — Assessment & Plan Note (Addendum)
Concern for A fib given irregularity on auscultation. EKG done with frequent PACs which accounts for PE findings. Checking BNP as heart failure is another possibility given long term alcohol abuse. FENO done which can point to asthma as the cause of symptoms. Checking CBC and CMP as well as thyroid function.

## 2018-01-26 NOTE — Patient Instructions (Signed)
The tests show that asthma could be the cause of the breathing problems.   We are checking some labs today to make sure there is not another cause and checking the thyroid and sugar levels.   We have sent in advair to use 1 puff twice daily until your breathing improves. We have also sent in prednisone to take 2 pills daily for 5 days to help the lungs.   We have refilled the albuterol inhaler as well.   Come back in 2-3 months so we can recheck the breathing.

## 2018-01-26 NOTE — Assessment & Plan Note (Signed)
BP above goal. Due to cough potentially coming from ACE-I will change to losartan/hctz 50/12.5 mg daily. Checking CMP.

## 2018-01-26 NOTE — Assessment & Plan Note (Signed)
Checking lipid panel and adjust simvastatin 40 mg daily.

## 2018-01-26 NOTE — Progress Notes (Signed)
   Subjective:    Patient ID: Mark Booth, male    DOB: 06/30/1960, 57 y.o.   MRN: [id number]  HPI The patient is a 57 YO man coming in new for medical care of his thyroid (taking synthroid 150 mcg daily, does have weight gain almost 50 pounds in the last 5 months or so, is eating more and drinking same amount alcohol) and blood pressure (BP normally running okay, taking lisinopril/hctz 10/12.5 mg daily, has chronic cough for last 3 months, denies fevers or chills) and alcohol abuse (admits to drinking about 2 bottles of wine per day, denies problems from his drinking, denies wanting to quit drinking) and new problem of SOB on exertion (started about 3 months ago along with dry cough, taking ACE-I, denies chest pains, does have some nose congestion and drainage which is intermittent, prior history of asthma and has been using albuterol inhaler which helps some, previously on advair a long time ago, denies fevers or chills, does have headaches intermittent, gained about 50 pounds in the last 5 months or so).   PMH, [place], social history reviewed and updated  Review of Systems  Constitutional: Positive for activity change, appetite change, fatigue and unexpected weight change. Negative for fever.  HENT: Positive for congestion, postnasal drip and rhinorrhea. Negative for ear discharge, ear pain, sinus pressure, sinus pain, sore throat, tinnitus, trouble swallowing and voice change.   Eyes: Negative.   Respiratory: Positive for cough and shortness of breath. Negative for chest tightness.   Cardiovascular: Negative for chest pain, palpitations and leg swelling.  Gastrointestinal: Negative for abdominal distention, abdominal pain, constipation, diarrhea, nausea and vomiting.  Endocrine: Negative.   Musculoskeletal: Negative.   Skin: Negative.   Neurological: Negative.   Psychiatric/Behavioral: Positive for decreased concentration and dysphoric mood.      Objective:   Physical Exam Constitutional:       Appearance: He is well-developed.     Comments: Overweight  HENT:     Head: Normocephalic and atraumatic.  Neck:     Musculoskeletal: Normal range of motion.  Cardiovascular:     Rate and Rhythm: Normal rate.     Comments: Heart rate sounds irregular Pulmonary:     Effort: Pulmonary effort is normal. No respiratory distress.     Breath sounds: Normal breath sounds. No wheezing or rales.  Abdominal:     General: Bowel sounds are normal. There is no distension.     Palpations: Abdomen is soft.     Tenderness: There is no abdominal tenderness. There is no rebound.  Skin:    General: Skin is warm and dry.  Neurological:     Mental Status: He is alert and oriented to person, place, and time.     Coordination: Coordination normal.    Vitals:   01/26/18 1259  BP: (!) 160/92  Pulse: 90  Temp: 97.7 F (36.5 C)  TempSrc: Oral  SpO2: 96%  Weight: 246 lb (111.6 kg)  Height: 6' (1.829 m)   EKG: Rate 86, axis normal, interval normal, some PACs, no st or t wave changes, sinus, no prior to compare.  FENO: 52    Assessment & Plan:

## 2018-01-26 NOTE — Assessment & Plan Note (Signed)
Checking vitamin D level.  

## 2018-01-26 NOTE — Assessment & Plan Note (Signed)
With flare today. FENO done in office 52. Rx for prednisone and advair and refill albuterol. Needs follow up in 2-3 months for recheck.

## 2018-01-27 ENCOUNTER — Telehealth: Payer: Self-pay | Admitting: Internal Medicine

## 2018-01-27 NOTE — Telephone Encounter (Signed)
Copied from [place] [id number]. Topic: Quick Communication - Rx Refill/Question >> Jan 27, 2018  4:56 PM Cecelia Byars, NT wrote: Medication:albuterol [facility] HFA) 108 (90 Base) MCG/ACT inhaler ,the pharmacy called and they are out of this medication and would like to change it , please advise   Has the patient contacted their pharmacy? yes  (Agent: If no, request that the patient contact the pharmacy for the refill. (Agent: If yes, when and what did the pharmacy advise?)  Preferred Pharmacy (with phone number or street name [place], [place] [phone] (Phone) [phone] (Fax)    Agent: Please be advised that RX refills may take up to 3 business days. We ask that you follow-up with your pharmacy.

## 2018-01-28 ENCOUNTER — Telehealth: Payer: Self-pay

## 2018-01-28 NOTE — Telephone Encounter (Signed)
Fine to interchange with ventolin or proventil with same sig

## 2018-01-28 NOTE — Telephone Encounter (Signed)
Routing to dr crawford---patient's last wellness visit was June/2016 with you---however, he was just seen by you on 12/16 (acute visit) and is needing b12 injections---do you want me to have patient follow up with you for wellness check after he receives several b12 injections, or do you prefer wellness visit be done soon---please advise, patient coming in on 12/20 for first b12 injection, I will let patient know when to follow up, thanks

## 2018-01-28 NOTE — Telephone Encounter (Signed)
Called and informed pharmacy of MD response

## 2018-01-29 NOTE — Telephone Encounter (Signed)
3 months is fine or sooner if patient prefers

## 2018-01-30 ENCOUNTER — Ambulatory Visit (INDEPENDENT_AMBULATORY_CARE_PROVIDER_SITE_OTHER): Payer: Medicare Other

## 2018-01-30 DIAGNOSIS — E538 Deficiency of other specified B group vitamins: Secondary | ICD-10-CM

## 2018-01-30 MED ORDER — CYANOCOBALAMIN 1000 MCG/ML IJ SOLN
1000.0000 ug | Freq: Once | INTRAMUSCULAR | Status: AC
  Administered 2018-01-30: 1000 ug via INTRAMUSCULAR

## 2018-01-30 NOTE — Progress Notes (Signed)
Medical treatment/procedure(s) were performed by non-physician practitioner and as supervising physician I was immediately available for consultation/collaboration. I agree with above. Taura Lamarre A Mackenna Kamer, MD  

## 2018-02-06 ENCOUNTER — Ambulatory Visit (INDEPENDENT_AMBULATORY_CARE_PROVIDER_SITE_OTHER): Payer: Medicare Other | Admitting: *Deleted

## 2018-02-06 DIAGNOSIS — E538 Deficiency of other specified B group vitamins: Secondary | ICD-10-CM | POA: Diagnosis not present

## 2018-02-06 MED ORDER — CYANOCOBALAMIN 1000 MCG/ML IJ SOLN
1000.0000 ug | Freq: Once | INTRAMUSCULAR | Status: AC
  Administered 2018-02-06: 1000 ug via INTRAMUSCULAR

## 2018-02-06 NOTE — Progress Notes (Signed)
Medical treatment/procedure(s) were performed by non-physician practitioner and as supervising physician I was immediately available for consultation/collaboration. I agree with above. Elizabeth A Crawford, MD  

## 2018-02-13 ENCOUNTER — Ambulatory Visit (INDEPENDENT_AMBULATORY_CARE_PROVIDER_SITE_OTHER): Payer: Medicare Other

## 2018-02-13 DIAGNOSIS — E538 Deficiency of other specified B group vitamins: Secondary | ICD-10-CM | POA: Diagnosis not present

## 2018-02-13 MED ORDER — CYANOCOBALAMIN 1000 MCG/ML IJ SOLN
1000.0000 ug | Freq: Once | INTRAMUSCULAR | Status: AC
  Administered 2018-02-13: 1000 ug via INTRAMUSCULAR

## 2018-02-13 NOTE — Progress Notes (Signed)
Medical treatment/procedure(s) were performed by non-physician practitioner and as supervising physician I was immediately available for consultation/collaboration. I agree with above. Quenton Recendez A Emojean Gertz, MD  

## 2018-02-17 ENCOUNTER — Telehealth: Payer: Self-pay | Admitting: Internal Medicine

## 2018-02-17 DIAGNOSIS — E039 Hypothyroidism, unspecified: Secondary | ICD-10-CM

## 2018-02-17 MED ORDER — LEVOTHYROXINE SODIUM 150 MCG PO TABS: 150.0000 ug | ORAL_TABLET | Freq: Every day | ORAL | 3 refills | Status: DC

## 2018-02-17 NOTE — Telephone Encounter (Signed)
Requested medication (s) are due for refill today: Yes  Requested medication (s) are on the active medication list: Yes  Last refill:  07/29/16  Future visit scheduled: Yes  Notes to clinic:  Unable to refill, expired medication.     Requested Prescriptions  Pending Prescriptions Disp Refills   levothyroxine (SYNTHROID, LEVOTHROID) 150 MCG tablet 30 tablet 0    Sig: Take 1 tablet (150 mcg total) by mouth daily.     Endocrinology:  Hypothyroid Agents Failed - 02/17/2018 10:49 AM      Failed - TSH needs to be rechecked within 3 months after an abnormal result. Refill until TSH is due.      Passed - TSH in normal range and within 360 days    TSH  Date Value Ref Range Status  01/26/2018 2.38 0.35 - 4.50 uIU/mL Final         Passed - Valid encounter within last 12 months    Recent Outpatient Visits          3 weeks ago SOB (shortness of breath) on exertion   [place], Elizabeth A, MD   3 years ago Routine general medical examination at a health care facility   [place], Page, MD      Future Appointments            In 1 month Sharlet Salina Real Cons, MD [place], [facility]

## 2018-02-17 NOTE — Telephone Encounter (Signed)
Copied from [place] [id number]. Topic: Quick Communication - Rx Refill/Question >> Feb 17, 2018  9:34 AM Mark Booth wrote: Medication: levothyroxine (SYNTHROID, LEVOTHROID) 150 MCG tablet [[id number]  Has the patient contacted their pharmacy? No  (Agent: If no, request that the patient contact the pharmacy for the refill.) (Agent: If yes, when and what did the pharmacy advise?)  Preferred Pharmacy (with phone number or street name): [place], [place] (Phone)   Agent: Please be advised that RX refills may take up to 3 business days. We ask that you follow-up with your pharmacy.

## 2018-02-17 NOTE — Telephone Encounter (Signed)
already been sent to [place] today

## 2018-02-18 ENCOUNTER — Telehealth: Payer: Self-pay

## 2018-02-18 NOTE — Telephone Encounter (Signed)
Received a refill request for patients lisinipril-HCTZ, but in a recent office visit it looks like the plan was to change to losartan/hctz 50/12.5 mg daily? Did not see where this was sent in

## 2018-02-19 ENCOUNTER — Other Ambulatory Visit: Payer: Self-pay

## 2018-02-19 MED ORDER — LISINOPRIL-HYDROCHLOROTHIAZIDE 10-12.5 MG PO TABS: ORAL_TABLET | ORAL | 1 refills | Status: DC

## 2018-02-19 NOTE — Telephone Encounter (Signed)
sent 

## 2018-02-19 NOTE — Addendum Note (Signed)
Addended by: Raford Pitcher R on: 02/19/2018 09:05 AM   Modules accepted: Orders

## 2018-02-19 NOTE — Telephone Encounter (Signed)
This change was discussed but we decided not to make. Can refill lisinopril/hctz

## 2018-02-20 ENCOUNTER — Ambulatory Visit (INDEPENDENT_AMBULATORY_CARE_PROVIDER_SITE_OTHER): Payer: Medicare Other | Admitting: *Deleted

## 2018-02-20 DIAGNOSIS — E538 Deficiency of other specified B group vitamins: Secondary | ICD-10-CM | POA: Diagnosis not present

## 2018-02-20 MED ORDER — CYANOCOBALAMIN 1000 MCG/ML IJ SOLN
1000.0000 ug | Freq: Once | INTRAMUSCULAR | Status: AC
  Administered 2018-02-20: 1000 ug via INTRAMUSCULAR

## 2018-02-20 NOTE — Progress Notes (Signed)
Medical treatment/procedure(s) were performed by non-physician practitioner and as supervising physician I was immediately available for consultation/collaboration. I agree with above. Kweku Stankey A Jakori Burkett, MD  

## 2018-03-12 ENCOUNTER — Other Ambulatory Visit: Payer: Self-pay

## 2018-03-27 ENCOUNTER — Ambulatory Visit (INDEPENDENT_AMBULATORY_CARE_PROVIDER_SITE_OTHER): Payer: Medicare Other

## 2018-03-27 DIAGNOSIS — E538 Deficiency of other specified B group vitamins: Secondary | ICD-10-CM | POA: Diagnosis not present

## 2018-03-27 MED ORDER — CYANOCOBALAMIN 1000 MCG/ML IJ SOLN
1000.0000 ug | Freq: Once | INTRAMUSCULAR | Status: AC
  Administered 2018-03-27: 1000 ug via INTRAMUSCULAR

## 2018-03-27 NOTE — Progress Notes (Signed)
Patient came in for b12 injection today (nurse visit)--also requesting information about why fenofibrate rx sent by pharmacy was denied---was seen by dr crawford in dec/2019, cholesterol labs have been done---routing to dr crawford, please advise on whether patient needs to be on this and if yes, can you send in to pharmacy----I will call patient back, thanks

## 2018-03-27 NOTE — Progress Notes (Signed)
Patient ID: Mark Booth, male   DOB: 31-Mar-1960, 58 y.o.   MRN: [id number] Medical treatment/procedure(s) were performed by non-physician practitioner and as supervising physician I was immediately available for consultation/collaboration. I agree with above. Hoyt Koch, MD  Please ask patient if he has been taking fenofibrate. We declined this as cholesterol was good and last time we had prescribed it was 2016 and he did not think he was taking this when we asked him recently at visit.

## 2018-04-06 ENCOUNTER — Ambulatory Visit (INDEPENDENT_AMBULATORY_CARE_PROVIDER_SITE_OTHER): Payer: Medicare Other | Admitting: Internal Medicine

## 2018-04-06 ENCOUNTER — Encounter: Payer: Self-pay | Admitting: Internal Medicine

## 2018-04-06 DIAGNOSIS — J4530 Mild persistent asthma, uncomplicated: Secondary | ICD-10-CM

## 2018-04-06 MED ORDER — FLUTICASONE-SALMETEROL 100-50 MCG/DOSE IN AEPB: 1.0000 | INHALATION_SPRAY | Freq: Two times a day (BID) | RESPIRATORY_TRACT | 11 refills | Status: DC

## 2018-04-06 MED ORDER — FENOFIBRATE 160 MG PO TABS: 160.0000 mg | ORAL_TABLET | Freq: Every day | ORAL | 3 refills | Status: DC

## 2018-04-06 NOTE — Assessment & Plan Note (Signed)
Doing well on advair, will continue. Use albuterol as needed.

## 2018-04-06 NOTE — Progress Notes (Signed)
   Subjective:   Patient ID: Mark Booth, male    DOB: 1960/04/17, 58 y.o.   MRN: [id number]  HPI The patient is a 58 YO man coming in for follow up on recent asthma exacerbation and SOB. Took prednisone and used advair and is now still taking advair BID. He is using albuterol if needed and only a couple times per week. Mostly prior to exercise as this is when his breathing is still a little bit SOB. Denies cough. Denies night time symptoms or awakening.   Review of Systems  Constitutional: Negative.   HENT: Negative.   Eyes: Negative.   Respiratory: Negative for cough, chest tightness and shortness of breath.   Cardiovascular: Negative for chest pain, palpitations and leg swelling.  Gastrointestinal: Negative for abdominal distention, abdominal pain, constipation, diarrhea, nausea and vomiting.  Musculoskeletal: Negative.   Skin: Negative.   Neurological: Negative.   Psychiatric/Behavioral: Negative.     Objective:  Physical Exam Constitutional:      Appearance: He is well-developed.  HENT:     Head: Normocephalic and atraumatic.  Neck:     Musculoskeletal: Normal range of motion.  Cardiovascular:     Rate and Rhythm: Normal rate and regular rhythm.  Pulmonary:     Effort: Pulmonary effort is normal. No respiratory distress.     Breath sounds: Normal breath sounds. No wheezing or rales.  Abdominal:     General: Bowel sounds are normal. There is no distension.     Palpations: Abdomen is soft.     Tenderness: There is no abdominal tenderness. There is no rebound.  Skin:    General: Skin is warm and dry.  Neurological:     Mental Status: He is alert and oriented to person, place, and time.     Coordination: Coordination normal.     Vitals:   04/06/18 1258  BP: 130/88  Pulse: 85  Temp: 97.7 F (36.5 C)  TempSrc: Oral  SpO2: 96%  Weight: 252 lb (114.3 kg)  Height: 6' (1.829 m)    Assessment & Plan:

## 2018-04-06 NOTE — Patient Instructions (Addendum)
I would be happy to see your mother.   Keep using the advair twice a day and the rescue albuterol inhaler if needed.   We have sent in the refill of the fenofibrate as well.

## 2018-04-20 ENCOUNTER — Telehealth: Payer: Self-pay | Admitting: Internal Medicine

## 2018-04-20 MED ORDER — SIMVASTATIN 40 MG PO TABS: 40.0000 mg | ORAL_TABLET | Freq: Every day | ORAL | 1 refills | Status: DC

## 2018-04-20 NOTE — Telephone Encounter (Signed)
No one in our office has denied any medications, so I am not sure why they are being denied. Rx has been sent

## 2018-04-20 NOTE — Telephone Encounter (Signed)
Copied from [place] [id number]. Topic: Quick Communication - Rx Refill/Question >> Apr 20, 2018 11:24 AM Margot Ables wrote: Medication: simvastatin (ZOCOR) 40 MG tablet - pt has 4 days left - pt said the pharmacy informed him the request was denied. Pt states he is a new pt with Dr. Sharlet Salina and this is the 2nd medication that was rejected. Please advise.  Pt prefers 90 day supply Has the patient contacted their pharmacy? yes Preferred Pharmacy (with phone number or street name): [place], [place] [phone] (Phone) [phone] (Fax)

## 2018-04-24 ENCOUNTER — Ambulatory Visit (INDEPENDENT_AMBULATORY_CARE_PROVIDER_SITE_OTHER): Payer: Medicare Other | Admitting: *Deleted

## 2018-04-24 ENCOUNTER — Other Ambulatory Visit: Payer: Self-pay

## 2018-04-24 DIAGNOSIS — E538 Deficiency of other specified B group vitamins: Secondary | ICD-10-CM

## 2018-04-24 DIAGNOSIS — Z23 Encounter for immunization: Secondary | ICD-10-CM | POA: Diagnosis not present

## 2018-04-24 MED ORDER — CYANOCOBALAMIN 1000 MCG/ML IJ SOLN
1000.0000 ug | Freq: Once | INTRAMUSCULAR | Status: AC
  Administered 2018-04-24: 1000 ug via INTRAMUSCULAR

## 2018-04-24 NOTE — Progress Notes (Signed)
Patient here for nurse visit B 12 injection and influenza vaccine.

## 2018-04-24 NOTE — Progress Notes (Signed)
Patient ID: Mark Booth, male   DOB: [birth year], 58 y.o.   MRN: [id number] Medical treatment/procedure(s) were performed by non-physician practitioner and as supervising physician I was immediately available for consultation/collaboration. I agree with above. Hoyt Koch, MD

## 2018-06-05 ENCOUNTER — Telehealth: Payer: Self-pay | Admitting: Internal Medicine

## 2018-06-05 NOTE — Telephone Encounter (Signed)
Please follow up in regard to scheduling patients B12

## 2018-06-08 NOTE — Telephone Encounter (Signed)
Patient will be called to reschedule b12 injections when we resume normal office visits 

## 2018-06-23 ENCOUNTER — Ambulatory Visit: Payer: Medicare Other

## 2018-06-26 ENCOUNTER — Ambulatory Visit (INDEPENDENT_AMBULATORY_CARE_PROVIDER_SITE_OTHER): Payer: Medicare Other

## 2018-06-26 DIAGNOSIS — E538 Deficiency of other specified B group vitamins: Secondary | ICD-10-CM | POA: Diagnosis not present

## 2018-06-26 MED ORDER — CYANOCOBALAMIN 1000 MCG/ML IJ SOLN
1000.0000 ug | Freq: Once | INTRAMUSCULAR | Status: AC
  Administered 2018-06-26: 1000 ug via INTRAMUSCULAR

## 2018-06-26 NOTE — Progress Notes (Signed)
Medical treatment/procedure(s) were performed by non-physician practitioner and as supervising physician I was immediately available for consultation/collaboration. I agree with above. Elizabeth A Crawford, MD  

## 2018-09-13 ENCOUNTER — Other Ambulatory Visit: Payer: Self-pay | Admitting: Internal Medicine

## 2018-10-05 ENCOUNTER — Other Ambulatory Visit (INDEPENDENT_AMBULATORY_CARE_PROVIDER_SITE_OTHER): Payer: Medicare Other

## 2018-10-05 ENCOUNTER — Ambulatory Visit (INDEPENDENT_AMBULATORY_CARE_PROVIDER_SITE_OTHER): Payer: Medicare Other | Admitting: Internal Medicine

## 2018-10-05 ENCOUNTER — Encounter: Payer: Self-pay | Admitting: Internal Medicine

## 2018-10-05 ENCOUNTER — Other Ambulatory Visit: Payer: Self-pay

## 2018-10-05 VITALS — BP 118/80 | HR 81 | Temp 98.0°F | Ht 72.0 in | Wt 224.0 lb

## 2018-10-05 DIAGNOSIS — E039 Hypothyroidism, unspecified: Secondary | ICD-10-CM

## 2018-10-05 DIAGNOSIS — Z86010 Personal history of colonic polyps: Secondary | ICD-10-CM

## 2018-10-05 DIAGNOSIS — Z0000 Encounter for general adult medical examination without abnormal findings: Secondary | ICD-10-CM

## 2018-10-05 DIAGNOSIS — F319 Bipolar disorder, unspecified: Secondary | ICD-10-CM | POA: Diagnosis not present

## 2018-10-05 DIAGNOSIS — I10 Essential (primary) hypertension: Secondary | ICD-10-CM

## 2018-10-05 DIAGNOSIS — E782 Mixed hyperlipidemia: Secondary | ICD-10-CM

## 2018-10-05 DIAGNOSIS — R7301 Impaired fasting glucose: Secondary | ICD-10-CM | POA: Diagnosis not present

## 2018-10-05 LAB — COMPREHENSIVE METABOLIC PANEL
ALT: 16 U/L (ref 0–53)
AST: 23 U/L (ref 0–37)
Albumin: 4.7 g/dL (ref 3.5–5.2)
Alkaline Phosphatase: 43 U/L (ref 39–117)
BUN: 16 mg/dL (ref 6–23)
CO2: 26 mEq/L (ref 19–32)
Calcium: 9.7 mg/dL (ref 8.4–10.5)
Chloride: 107 mEq/L (ref 96–112)
Creatinine, Ser: 1.18 mg/dL (ref 0.40–1.50)
GFR: 63.74 mL/min (ref >60.00)
Glucose, Bld: 130 mg/dL — ABNORMAL HIGH (ref 70–99)
Potassium: 3.6 mEq/L (ref 3.5–5.1)
Sodium: 143 mEq/L (ref 135–145)
Total Bilirubin: 0.5 mg/dL (ref 0.2–1.2)
Total Protein: 7.2 g/dL (ref 6.0–8.3)

## 2018-10-05 LAB — CBC
HCT: 40.1 % (ref 39.0–52.0)
Hemoglobin: 13.8 g/dL (ref 13.0–17.0)
MCHC: 34.3 g/dL (ref 30.0–36.0)
MCV: 92.1 fl (ref 78.0–100.0)
Platelets: 281 10*3/uL (ref 150.0–400.0)
RBC: 4.36 Mil/uL (ref 4.22–5.81)
RDW: 13.4 % (ref 11.5–15.5)
WBC: 10.1 10*3/uL (ref 4.0–10.5)

## 2018-10-05 LAB — LIPID PANEL
Cholesterol: 187 mg/dL (ref 0–200)
HDL: 48.2 mg/dL (ref >39.00)
LDL Cholesterol: 100 mg/dL — ABNORMAL HIGH (ref 0–99)
NonHDL: 138.4
Total CHOL/HDL Ratio: 4
Triglycerides: 190 mg/dL — ABNORMAL HIGH (ref 0.0–149.0)
VLDL: 38 mg/dL (ref 0.0–40.0)

## 2018-10-05 LAB — HEMOGLOBIN A1C: Hgb A1c MFr Bld: 5.7 % (ref 4.6–6.5)

## 2018-10-05 MED ORDER — ZOSTER VAC RECOMB ADJUVANTED 50 MCG/0.5ML IM SUSR: 0.5000 mL | Freq: Once | INTRAMUSCULAR | 1 refills | Status: AC

## 2018-10-05 NOTE — Assessment & Plan Note (Signed)
Taking lisinopril/hctz and adjust as needed. BP at goal. Checking CMP.

## 2018-10-05 NOTE — Assessment & Plan Note (Signed)
Flu shot declines. Shingrix given rx today. Tetanus declines. Colonoscopy referral to GI done today prior polyps. Counseled about sun safety and mole surveillance. Counseled about the dangers of distracted driving. Given 10 year screening recommendations.

## 2018-10-05 NOTE — Patient Instructions (Signed)

## 2018-10-05 NOTE — Progress Notes (Signed)
Subjective:   Patient ID: Mark Booth, male    DOB: 07-31-1960, 58 y.o.   MRN: [id number]  HPI Here for medicare wellness, no new complaints. Please see A/P for status and treatment of chronic medical problems.   HPI #2: Here for follow up cholesterol (taking simvastatin and fenofibrate, denies missing doses, denies chest pains or stroke symptoms) and blood pressure (taking lisinopril/hctz, denies side effects or missing doses, denies chest pains or headaches) and thyroid (taking synthroid 150 mcg daily, denies missing doses, denies heat or cold intolerance)  Diet: heart healthy Physical activity: sedentary Depression/mood screen: negative Hearing: intact to whispered voice Visual acuity: grossly normal, performs annual eye exam  ADLs: capable Fall risk: none Home safety: good Cognitive evaluation: intact to orientation, naming, recall and repetition EOL planning: adv directives discussed    Office Visit from 10/05/2018 in [place]  PHQ-2 Total Score  1      I have personally reviewed and have noted 1. The patient's medical and social history - reviewed today no changes 2. Their use of alcohol, tobacco or illicit drugs 3. Their current medications and supplements 4. The patient's functional ability including ADL's, fall risks, home safety risks and hearing or visual impairment. 5. Diet and physical activities 6. Evidence for depression or mood disorders 7. Care team reviewed and updated  Patient Care Team: Hoyt Koch, MD as PCP - General (Internal Medicine) Past Medical History:  Diagnosis Date  . Allergic rhinitis   . Asthma   . Bipolar disorder ([place])   . Hyperlipidemia   . Hypertension   . Hypothyroidism   . Osteoarthritis   . Overweight(278.02)   . Unspecified vitamin D deficiency    Past Surgical History:  Procedure Laterality Date  . APPENDECTOMY    . CATARACT EXTRACTION  12-2012   BILATERAL  . SHOULDER SURGERY  2013    Family History  Problem Relation Age of Onset  . Osteoporosis Mother   . Depression Mother   . Diabetes Mother   . Hypothyroidism Mother   . Prostate cancer Father   . Colon cancer Paternal Grandfather   . Rectal cancer Neg Hx   . Stomach cancer Neg Hx    Review of Systems  Constitutional: Negative.   HENT: Negative.   Eyes: Negative.   Respiratory: Negative for cough, chest tightness and shortness of breath.   Cardiovascular: Negative for chest pain, palpitations and leg swelling.  Gastrointestinal: Negative for abdominal distention, abdominal pain, constipation, diarrhea, nausea and vomiting.  Musculoskeletal: Negative.   Skin: Negative.   Neurological: Negative.   Psychiatric/Behavioral: Negative.     Objective:  Physical Exam Constitutional:      Appearance: He is well-developed. He is obese.  HENT:     Head: Normocephalic and atraumatic.  Neck:     Musculoskeletal: Normal range of motion.  Cardiovascular:     Rate and Rhythm: Normal rate and regular rhythm.  Pulmonary:     Effort: Pulmonary effort is normal. No respiratory distress.     Breath sounds: Normal breath sounds. No wheezing or rales.  Abdominal:     General: Bowel sounds are normal. There is no distension.     Palpations: Abdomen is soft.     Tenderness: There is no abdominal tenderness. There is no rebound.  Skin:    General: Skin is warm and dry.  Neurological:     Mental Status: He is alert and oriented to person, place, and time.  Coordination: Coordination normal.     Vitals:   10/05/18 1259  BP: 118/80  Pulse: 81  Temp: 98 F (36.7 C)  TempSrc: Oral  SpO2: 96%  Weight: 224 lb (101.6 kg)  Height: 6' (1.829 m)    Assessment & Plan:

## 2018-10-05 NOTE — Assessment & Plan Note (Signed)
Checking lipid panel. Taking fenofibrate and simvastatin 40 mg daily. Adjust as needed.

## 2018-10-05 NOTE — Assessment & Plan Note (Signed)
Seeing psych still and overall satisfied with level of control. Taking abilify, lamictal, prozac, trintellix.

## 2018-10-05 NOTE — Assessment & Plan Note (Signed)
Checking TSH and adjust as needed synthroid 150 mcg daily.

## 2018-10-06 LAB — TSH: TSH: 0.42 u[IU]/mL (ref 0.35–4.50)

## 2018-10-16 ENCOUNTER — Encounter: Payer: Self-pay | Admitting: Internal Medicine

## 2018-11-04 ENCOUNTER — Ambulatory Visit (AMBULATORY_SURGERY_CENTER): Payer: Self-pay | Admitting: *Deleted

## 2018-11-04 ENCOUNTER — Other Ambulatory Visit: Payer: Self-pay

## 2018-11-04 VITALS — Temp 96.6°F | Ht 72.0 in | Wt 221.0 lb

## 2018-11-04 DIAGNOSIS — Z86010 Personal history of colonic polyps: Secondary | ICD-10-CM

## 2018-11-04 MED ORDER — NA SULFATE-K SULFATE-MG SULF 17.5-3.13-1.6 GM/177ML PO SOLN: 1.0000 | Freq: Once | ORAL | 0 refills | Status: AC

## 2018-11-04 NOTE — Progress Notes (Signed)

## 2018-11-16 ENCOUNTER — Other Ambulatory Visit: Payer: Self-pay | Admitting: Internal Medicine

## 2018-11-17 ENCOUNTER — Telehealth: Payer: Self-pay

## 2018-11-17 NOTE — Telephone Encounter (Signed)
Covid-19 screening questions   Do you now or have you had a fever in the last 14 days?  Do you have any respiratory symptoms of shortness of breath or cough now or in the last 14 days?  Do you have any family members or close contacts with diagnosed or suspected Covid-19 in the past 14 days?  Have you been tested for Covid-19 and found to be positive?       

## 2018-11-17 NOTE — Telephone Encounter (Signed)
Pt responded "no" to all screening questions °

## 2018-11-18 ENCOUNTER — Other Ambulatory Visit: Payer: Self-pay

## 2018-11-18 ENCOUNTER — Encounter: Payer: Self-pay | Admitting: Internal Medicine

## 2018-11-18 ENCOUNTER — Ambulatory Visit (AMBULATORY_SURGERY_CENTER): Payer: Medicare Other | Admitting: Internal Medicine

## 2018-11-18 VITALS — BP 130/82 | HR 78 | Temp 96.6°F | Resp 15 | Ht 72.0 in | Wt 221.0 lb

## 2018-11-18 DIAGNOSIS — D122 Benign neoplasm of ascending colon: Secondary | ICD-10-CM

## 2018-11-18 DIAGNOSIS — K5140 Inflammatory polyps of colon without complications: Secondary | ICD-10-CM

## 2018-11-18 DIAGNOSIS — Z86010 Personal history of colonic polyps: Secondary | ICD-10-CM | POA: Diagnosis not present

## 2018-11-18 DIAGNOSIS — D123 Benign neoplasm of transverse colon: Secondary | ICD-10-CM

## 2018-11-18 DIAGNOSIS — D124 Benign neoplasm of descending colon: Secondary | ICD-10-CM

## 2018-11-18 MED ORDER — SODIUM CHLORIDE 0.9 % IV SOLN: 500.0000 mL | Freq: Once | INTRAVENOUS | Status: DC

## 2018-11-18 NOTE — Progress Notes (Signed)
A and O x3. Report to RN. Tolerated MAC anesthesia well.

## 2018-11-18 NOTE — Progress Notes (Signed)
Pt's states no medical or surgical changes since previsit or office visit. 

## 2018-11-18 NOTE — Op Note (Signed)
[facility] Patient Name: Mark Booth Procedure Date: 11/18/2018 10:50 AM MRN: [id number] Endoscopist: Docia Chuck. Henrene Pastor , MD Age: 58 Referring MD:  Date of Birth: 08/02/1960 Gender: Male Account #: [id number] Procedure:                Colonoscopy with cold snare polypectomy x 3 Indications:              High risk colon cancer surveillance: Personal                            history of non-advanced adenomas. Previous                            examination January 2015 Medicines:                Monitored Anesthesia Care Procedure:                Pre-Anesthesia Assessment:                           - Prior to the procedure, a History and Physical                            was performed, and patient medications and                            allergies were reviewed. The patient's tolerance of                            previous anesthesia was also reviewed. The risks                            and benefits of the procedure and the sedation                            options and risks were discussed with the patient.                            All questions were answered, and informed consent                            was obtained. Prior Anticoagulants: The patient has                            taken no previous anticoagulant or antiplatelet                            agents. ASA Grade Assessment: II - A patient with                            mild systemic disease. After reviewing the risks                            and benefits, the patient was deemed in  satisfactory condition to undergo the procedure.                           After obtaining informed consent, the colonoscope                            was passed under direct vision. Throughout the                            procedure, the patient's blood pressure, pulse, and                            oxygen saturations were monitored continuously. The                            Colonoscope was  introduced through the anus and                            advanced to the the cecum, identified by                            appendiceal orifice and ileocecal valve. The                            ileocecal valve, appendiceal orifice, and rectum                            were photographed. The quality of the bowel                            preparation was excellent. The colonoscopy was                            performed without difficulty. The patient tolerated                            the procedure well. The bowel preparation used was                            SUPREP via split dose instruction. Scope In: 10:56:14 AM Scope Out: 11:11:13 AM Scope Withdrawal Time: 0 hours 12 minutes 38 seconds  Total Procedure Duration: 0 hours 14 minutes 59 seconds  Findings:                 Three polyps were found in the descending colon,                            transverse colon and ascending colon. The polyps                            were 1 to 5 mm in size. These polyps were removed                            with a cold snare. Resection and retrieval  were                            complete.                           The exam was otherwise without abnormality on                            direct and retroflexion views. Complications:            No immediate complications. Estimated blood loss:                            None. Estimated Blood Loss:     Estimated blood loss: none. Impression:               - Three 1 to 5 mm polyps in the descending colon,                            in the transverse colon and in the ascending colon,                            removed with a cold snare. Resected and retrieved.                           - The examination was otherwise normal on direct                            and retroflexion views. Recommendation:           - Repeat colonoscopy in 5 years for surveillance.                           - Patient has a contact number available for                             emergencies. The signs and symptoms of potential                            delayed complications were discussed with the                            patient. Return to normal activities tomorrow.                            Written discharge instructions were provided to the                            patient.                           - Resume previous diet.                           - Continue present medications.                           -  Await pathology results. Docia Chuck. Henrene Pastor, MD 11/18/2018 11:16:39 AM This report has been signed electronically.

## 2018-11-18 NOTE — Progress Notes (Signed)
Called to room to assist during endoscopic procedure.  Patient ID and intended procedure confirmed with present staff. Received instructions for my participation in the procedure from the performing physician.  

## 2018-11-18 NOTE — Patient Instructions (Signed)
YOU HAD AN ENDOSCOPIC PROCEDURE TODAY AT THE [facility]:   Refer to the procedure report that was given to you for any specific questions about what was found during the examination.  If the procedure report does not answer your questions, please call your gastroenterologist to clarify.  If you requested that your care partner not be given the details of your procedure findings, then the procedure report has been included in a sealed envelope for you to review at your convenience later.  YOU SHOULD EXPECT: Some feelings of bloating in the abdomen. Passage of more gas than usual.  Walking can help get rid of the air that was put into your GI tract during the procedure and reduce the bloating. If you had a lower endoscopy (such as a colonoscopy or flexible sigmoidoscopy) you may notice spotting of blood in your stool or on the toilet paper. If you underwent a bowel prep for your procedure, you may not have a normal bowel movement for a few days.  Please Note:  You might notice some irritation and congestion in your nose or some drainage.  This is from the oxygen used during your procedure.  There is no need for concern and it should clear up in a day or so.  SYMPTOMS TO REPORT IMMEDIATELY:   Following lower endoscopy (colonoscopy or flexible sigmoidoscopy):  Excessive amounts of blood in the stool  Significant tenderness or worsening of abdominal pains  Swelling of the abdomen that is new, acute  Fever of 100F or higher   For urgent or emergent issues, a gastroenterologist can be reached at any hour by calling [phone].   DIET:  We do recommend a small meal at first, but then you may proceed to your regular diet.  Drink plenty of fluids but you should avoid alcoholic beverages for 24 hours.  MEDICATIONS: Continue present medications.  Please see handouts given to you by your recovery nurse.  ACTIVITY:  You should plan to take it easy for the rest of today and you should  NOT DRIVE or use heavy machinery until tomorrow (because of the sedation medicines used during the test).    FOLLOW UP: Our staff will call the number listed on your records 48-72 hours following your procedure to check on you and address any questions or concerns that you may have regarding the information given to you following your procedure. If we do not reach you, we will leave a message.  We will attempt to reach you two times.  During this call, we will ask if you have developed any symptoms of COVID 19. If you develop any symptoms (ie: fever, flu-like symptoms, shortness of breath, cough etc.) before then, please call [phone].  If you test positive for Covid 19 in the 2 weeks post procedure, please call and report this information to us.    If any biopsies were taken you will be contacted by phone or by letter within the next 1-3 weeks.  Please call us at [phone] if you have not heard about the biopsies in 3 weeks.   Thank you for allowing us to provide for your healthcare needs today.   SIGNATURES/CONFIDENTIALITY: You and/or your care partner have signed paperwork which will be entered into your electronic medical record.  These signatures attest to the fact that that the information above on your After Visit Summary has been reviewed and is understood.  Full responsibility of the confidentiality of this discharge information lies with you and/or   your care-partner. 

## 2018-11-18 NOTE — Progress Notes (Signed)
Temp per [facility] VS per [facility]

## 2018-11-20 ENCOUNTER — Telehealth: Payer: Self-pay

## 2018-11-20 NOTE — Telephone Encounter (Signed)
Covid-19 screening questions   Do you now or have you had a fever in the last 14 days? No.  Do you have any respiratory symptoms of shortness of breath or cough now or in the last 14 days? No.  Do you have any family members or close contacts with diagnosed or suspected Covid-19 in the past 14 days? No.  Have you been tested for Covid-19 and found to be positive? No.       Follow up Call-  Call back number 11/18/2018  Post procedure Call Back phone  # [phone]  Permission to leave phone message Yes  Some recent data might be hidden     Patient questions:  Do you have a fever, pain , or abdominal swelling? No. Pain Score  0 *  Have you tolerated food without any problems? Yes.    Have you been able to return to your normal activities? Yes.    Do you have any questions about your discharge instructions: Diet   No. Medications  No. Follow up visit  No.  Do you have questions or concerns about your Care? No.  Actions: * If pain score is 4 or above: No action needed, pain <4.

## 2018-11-21 ENCOUNTER — Encounter: Payer: Self-pay | Admitting: Internal Medicine

## 2019-03-06 ENCOUNTER — Other Ambulatory Visit: Payer: Self-pay | Admitting: Internal Medicine

## 2019-03-06 DIAGNOSIS — E039 Hypothyroidism, unspecified: Secondary | ICD-10-CM

## 2019-03-19 ENCOUNTER — Other Ambulatory Visit: Payer: Self-pay | Admitting: Internal Medicine

## 2019-05-20 ENCOUNTER — Other Ambulatory Visit: Payer: Self-pay | Admitting: Internal Medicine

## 2019-06-09 ENCOUNTER — Telehealth: Payer: Self-pay | Admitting: Internal Medicine

## 2019-06-09 NOTE — Progress Notes (Signed)
  Chronic Care Management   Outreach Note  06/09/2019 Name: Mark Booth MRN: [id number] DOB: January 21, 1961  Referred by: Hoyt Koch, MD Reason for referral : No chief complaint on file.   An unsuccessful telephone outreach was attempted today. The patient was referred to the pharmacist for assistance with care management and care coordination.    This note is not being shared with the patient for the following reason: To respect privacy (The patient or proxy has requested that the information not be shared).  Follow Up Plan:   Raynicia Dukes [employer] Scheduler

## 2019-06-09 NOTE — Progress Notes (Signed)
  Chronic Care Management   Note  06/09/2019 Name: Mark Booth MRN: [id number] DOB: [birth year]  Mark Booth is a 59 y.o. year old male who is a primary care patient of Hoyt Koch, MD. I reached out to [employer] by phone today in response to a referral sent by Mark Booth PCP, Hoyt Koch, MD.   [name] was given information about Chronic Care Management services today including:  1. CCM service includes personalized support from designated clinical staff supervised by his physician, including individualized plan of care and coordination with other care providers 2. 24/7 contact phone numbers for assistance for urgent and routine care needs. 3. Service will only be billed when office clinical staff spend 20 minutes or more in a month to coordinate care. 4. Only one practitioner may furnish and bill the service in a calendar month. 5. The patient may stop CCM services at any time (effective at the end of the month) by phone call to the office staff.   Patient agreed to services and verbal consent obtained.    This note is not being shared with the patient for the following reason: To respect privacy (The patient or proxy has requested that the information not be shared).  Follow up plan:   Raynicia Dukes [employer] Scheduler

## 2019-06-15 ENCOUNTER — Ambulatory Visit: Payer: Medicare Other | Admitting: Internal Medicine

## 2019-06-17 ENCOUNTER — Ambulatory Visit (INDEPENDENT_AMBULATORY_CARE_PROVIDER_SITE_OTHER): Payer: Medicare Other | Admitting: Internal Medicine

## 2019-06-17 ENCOUNTER — Other Ambulatory Visit: Payer: Self-pay

## 2019-06-17 ENCOUNTER — Encounter: Payer: Self-pay | Admitting: Internal Medicine

## 2019-06-17 VITALS — BP 150/86 | HR 70 | Temp 98.5°F | Ht 72.0 in | Wt 221.8 lb

## 2019-06-17 DIAGNOSIS — E039 Hypothyroidism, unspecified: Secondary | ICD-10-CM | POA: Diagnosis not present

## 2019-06-17 DIAGNOSIS — I10 Essential (primary) hypertension: Secondary | ICD-10-CM

## 2019-06-17 DIAGNOSIS — R351 Nocturia: Secondary | ICD-10-CM | POA: Diagnosis not present

## 2019-06-17 MED ORDER — TAMSULOSIN HCL 0.4 MG PO CAPS: 0.4000 mg | ORAL_CAPSULE | Freq: Every day | ORAL | 3 refills | Status: DC

## 2019-06-17 MED ORDER — FLUTICASONE-SALMETEROL 100-50 MCG/DOSE IN AEPB: 1.0000 | INHALATION_SPRAY | Freq: Two times a day (BID) | RESPIRATORY_TRACT | 11 refills | Status: AC

## 2019-06-17 MED ORDER — ALBUTEROL SULFATE HFA 108 (90 BASE) MCG/ACT IN AERS: INHALATION_SPRAY | RESPIRATORY_TRACT | 1 refills | Status: DC

## 2019-06-17 NOTE — Progress Notes (Signed)
   Subjective:   Patient ID: Mark Booth, male    DOB: Jul 05, 1960, 59 y.o.   MRN: [id number]  HPI The patient is a 59 YO man coming in for follow up thyroid (mood is slightly worse with pandemic, denies heat or cold intolerance, takes synthroid daily) and blood pressure (BP doing well overall, not checking at home, taking lisinopril/hctz in the morning, denies headaches or chest pains) and urinary frequency (going on for some time, worse in the last several months, does drink liquids in the evening, denies caffeine in the evening).    Review of Systems  Constitutional: Negative.   HENT: Negative.   Eyes: Negative.   Respiratory: Negative for cough, chest tightness and shortness of breath.   Cardiovascular: Negative for chest pain, palpitations and leg swelling.  Gastrointestinal: Negative for abdominal distention, abdominal pain, constipation, diarrhea, nausea and vomiting.  Genitourinary: Positive for enuresis.  Musculoskeletal: Negative.   Skin: Negative.   Neurological: Negative.   Psychiatric/Behavioral: Negative.     Objective:  Physical Exam Constitutional:      Appearance: He is well-developed.  HENT:     Head: Normocephalic and atraumatic.  Cardiovascular:     Rate and Rhythm: Normal rate and regular rhythm.  Pulmonary:     Effort: Pulmonary effort is normal. No respiratory distress.     Breath sounds: Normal breath sounds. No wheezing or rales.  Abdominal:     General: Bowel sounds are normal. There is no distension.     Palpations: Abdomen is soft.     Tenderness: There is no abdominal tenderness. There is no rebound.  Musculoskeletal:     Cervical back: Normal range of motion.  Skin:    General: Skin is warm and dry.  Neurological:     Mental Status: He is alert and oriented to person, place, and time.     Coordination: Coordination normal.     Vitals:   06/17/19 1313  BP: (!) 150/86  Pulse: 70  Temp: 98.5 F (36.9 C)  SpO2: 100%  Weight: 221 lb 12.8  oz (100.6 kg)  Height: 6' (1.829 m)    This visit occurred during the SARS-CoV-2 public health emergency.  Safety protocols were in place, including screening questions prior to the visit, additional usage of staff PPE, and extensive cleaning of exam room while observing appropriate contact time as indicated for disinfecting solutions.   Assessment & Plan:

## 2019-06-17 NOTE — Patient Instructions (Signed)
We will check the labs today. We have sent in a medicine called flomax (tamsulosin) to take in the evening to help with that.

## 2019-06-18 DIAGNOSIS — R351 Nocturia: Secondary | ICD-10-CM | POA: Insufficient documentation

## 2019-06-18 NOTE — Assessment & Plan Note (Signed)
Checking CMP and adjust lisinopril/hctz as needed.

## 2019-06-18 NOTE — Assessment & Plan Note (Signed)
Checking PSA and rx flomax to try for symptoms.

## 2019-06-18 NOTE — Assessment & Plan Note (Signed)
Checking TSH and adjust synthroid 150 mcg daily as needed. 

## 2019-06-28 ENCOUNTER — Other Ambulatory Visit: Payer: Medicare Other

## 2019-06-28 LAB — COMPREHENSIVE METABOLIC PANEL
ALT: 15 U/L (ref 0–53)
AST: 20 U/L (ref 0–37)
Albumin: 4.6 g/dL (ref 3.5–5.2)
Alkaline Phosphatase: 45 U/L (ref 39–117)
BUN: 19 mg/dL (ref 6–23)
CO2: 28 mEq/L (ref 19–32)
Calcium: 9.7 mg/dL (ref 8.4–10.5)
Chloride: 105 mEq/L (ref 96–112)
Creatinine, Ser: 1.16 mg/dL (ref 0.40–1.50)
GFR: 64.85 mL/min (ref >60.00)
Glucose, Bld: 119 mg/dL — ABNORMAL HIGH (ref 70–99)
Potassium: 4.1 mEq/L (ref 3.5–5.1)
Sodium: 140 mEq/L (ref 135–145)
Total Bilirubin: 0.6 mg/dL (ref 0.2–1.2)
Total Protein: 6.9 g/dL (ref 6.0–8.3)

## 2019-06-28 LAB — CBC
HCT: 38.6 % — ABNORMAL LOW (ref 39.0–52.0)
Hemoglobin: 13.6 g/dL (ref 13.0–17.0)
MCHC: 35.1 g/dL (ref 30.0–36.0)
MCV: 90.4 fl (ref 78.0–100.0)
Platelets: 294 10*3/uL (ref 150.0–400.0)
RBC: 4.27 Mil/uL (ref 4.22–5.81)
RDW: 12.9 % (ref 11.5–15.5)
WBC: 9.1 10*3/uL (ref 4.0–10.5)

## 2019-06-29 LAB — TSH: TSH: 0.73 u[IU]/mL (ref 0.35–4.50)

## 2019-06-29 LAB — PSA: PSA: 0.69 ng/mL (ref 0.10–4.00)

## 2019-07-05 ENCOUNTER — Telehealth: Payer: Medicare Other

## 2019-07-26 ENCOUNTER — Ambulatory Visit: Payer: Medicare Other | Admitting: Internal Medicine

## 2019-08-26 ENCOUNTER — Other Ambulatory Visit: Payer: Self-pay

## 2019-08-26 ENCOUNTER — Ambulatory Visit (INDEPENDENT_AMBULATORY_CARE_PROVIDER_SITE_OTHER): Payer: Medicare Other | Admitting: Internal Medicine

## 2019-08-26 ENCOUNTER — Encounter: Payer: Self-pay | Admitting: Internal Medicine

## 2019-08-26 VITALS — BP 128/84 | HR 94 | Temp 98.7°F | Ht 72.0 in | Wt 217.0 lb

## 2019-08-26 DIAGNOSIS — R351 Nocturia: Secondary | ICD-10-CM

## 2019-08-26 DIAGNOSIS — Z0000 Encounter for general adult medical examination without abnormal findings: Secondary | ICD-10-CM

## 2019-08-26 DIAGNOSIS — R112 Nausea with vomiting, unspecified: Secondary | ICD-10-CM

## 2019-08-26 DIAGNOSIS — E538 Deficiency of other specified B group vitamins: Secondary | ICD-10-CM

## 2019-08-26 DIAGNOSIS — R7301 Impaired fasting glucose: Secondary | ICD-10-CM

## 2019-08-26 DIAGNOSIS — E559 Vitamin D deficiency, unspecified: Secondary | ICD-10-CM

## 2019-08-26 DIAGNOSIS — I10 Essential (primary) hypertension: Secondary | ICD-10-CM

## 2019-08-26 DIAGNOSIS — F319 Bipolar disorder, unspecified: Secondary | ICD-10-CM

## 2019-08-26 MED ORDER — ALFUZOSIN HCL ER 10 MG PO TB24: 10.0000 mg | ORAL_TABLET | Freq: Every day | ORAL | 3 refills | Status: DC

## 2019-08-26 NOTE — Progress Notes (Signed)
° °  Subjective:   Patient ID: Mark Booth, male    DOB: 1960/06/16, 59 y.o.   MRN: [id number]  HPI The patient is a 59 YO man coming in for concerns about flomax (started taking this back in May, does not feel it has helped much, got more relief from stopping drinking liquids after 10pm, would like to try something different, is down to 3 times per night, previously was about every 1-2 hours) and nausea/vomiting (in the past 3 weeks has vomited in the morning 3 times, sometimes is nauseous when waking up, does drink coffee several cups before making food, denies seeing a pattern, no abdominal pain, denies blood in stool) and mental health (he needs lab for mental health provider and wants to see if we can draw those, denies current concerns with his mental health medications, denies excessive thirst or urination, needs lipid, CBC, CMP, HgA1c, vitamin D and vitamin B12)  Review of Systems  Constitutional: Negative.   HENT: Negative.   Eyes: Negative.   Respiratory: Negative for cough, chest tightness and shortness of breath.   Cardiovascular: Negative for chest pain, palpitations and leg swelling.  Gastrointestinal: Positive for nausea and vomiting. Negative for abdominal distention, abdominal pain, constipation and diarrhea.  Genitourinary: Positive for enuresis and frequency.  Musculoskeletal: Negative.   Skin: Negative.   Neurological: Negative.   Psychiatric/Behavioral: Negative.     Objective:  Physical Exam Constitutional:      Appearance: He is well-developed.  HENT:     Head: Normocephalic and atraumatic.  Cardiovascular:     Rate and Rhythm: Normal rate and regular rhythm.  Pulmonary:     Effort: Pulmonary effort is normal. No respiratory distress.     Breath sounds: Normal breath sounds. No wheezing or rales.  Abdominal:     General: Bowel sounds are normal. There is no distension.     Palpations: Abdomen is soft.     Tenderness: There is no abdominal tenderness. There is  no rebound.  Musculoskeletal:     Cervical back: Normal range of motion.  Skin:    General: Skin is warm and dry.  Neurological:     Mental Status: He is alert and oriented to person, place, and time.     Coordination: Coordination normal.     Vitals:   08/26/19 1038  BP: 128/84  Pulse: 94  Temp: 98.7 F (37.1 C)  TempSrc: Oral  SpO2: 98%  Weight: 217 lb (98.4 kg)  Height: 6' (1.829 m)    This visit occurred during the SARS-CoV-2 public health emergency.  Safety protocols were in place, including screening questions prior to the visit, additional usage of staff PPE, and extensive cleaning of exam room while observing appropriate contact time as indicated for disinfecting solutions.   Assessment & Plan:

## 2019-08-26 NOTE — Assessment & Plan Note (Signed)
His psych provider requests labs, he is at increased risk for metabolic syndrome due to meds. Ordered lipid, CBC, CMP, vitamin D, B12, HgA1c.

## 2019-08-26 NOTE — Assessment & Plan Note (Signed)
Could be related to GERD exacerbated by caffeine in the morning. Advised to try pepcid in the evening and if no relief let us know we can try PPI.

## 2019-08-26 NOTE — Patient Instructions (Addendum)
We will check the labs today and have sent in the alfuzosin to try 1 pill daily in the evening. Give this about 3-4 weeks and then let us know with mychart message or phone call if it is working.  You can try pepcid in the evening to see if this helps with the nausea and vomiting.

## 2019-08-26 NOTE — Assessment & Plan Note (Signed)
Change flomax to alfuzosin to see if this is more effective. We talked about how decreasing fluid intake in the evening may be most effective and he should continue this.

## 2019-08-27 ENCOUNTER — Other Ambulatory Visit: Payer: Self-pay | Admitting: Internal Medicine

## 2019-08-27 DIAGNOSIS — E039 Hypothyroidism, unspecified: Secondary | ICD-10-CM

## 2019-08-27 LAB — COMPREHENSIVE METABOLIC PANEL
AG Ratio: 1.9 (calc) (ref 1.0–2.5)
ALT: 19 U/L (ref 9–46)
AST: 26 U/L (ref 10–35)
Albumin: 5 g/dL (ref 3.6–5.1)
Alkaline phosphatase (APISO): 48 U/L (ref 35–144)
BUN: 18 mg/dL (ref 7–25)
CO2: 26 mmol/L (ref 20–32)
Calcium: 10.3 mg/dL (ref 8.6–10.3)
Chloride: 104 mmol/L (ref 98–110)
Creat: 1.12 mg/dL (ref 0.70–1.33)
Globulin: 2.7 g/dL (calc) (ref 1.9–3.7)
Glucose, Bld: 106 mg/dL — ABNORMAL HIGH (ref 65–99)
Potassium: 4.2 mmol/L (ref 3.5–5.3)
Sodium: 141 mmol/L (ref 135–146)
Total Bilirubin: 0.7 mg/dL (ref 0.2–1.2)
Total Protein: 7.7 g/dL (ref 6.1–8.1)

## 2019-08-27 LAB — CBC
HCT: 43.3 % (ref 38.5–50.0)
Hemoglobin: 14.8 g/dL (ref 13.2–17.1)
MCH: 31 pg (ref 27.0–33.0)
MCHC: 34.2 g/dL (ref 32.0–36.0)
MCV: 90.6 fL (ref 80.0–100.0)
MPV: 10.4 fL (ref 7.5–12.5)
Platelets: 307 10*3/uL (ref 140–400)
RBC: 4.78 10*6/uL (ref 4.20–5.80)
RDW: 12.7 % (ref 11.0–15.0)
WBC: 9.1 10*3/uL (ref 3.8–10.8)

## 2019-08-27 LAB — LIPID PANEL
Cholesterol: 181 mg/dL (ref <200)
HDL: 73 mg/dL (ref >=40)
LDL Cholesterol (Calc): 91 mg/dL (calc)
Non-HDL Cholesterol (Calc): 108 mg/dL (calc) (ref <130)
Total CHOL/HDL Ratio: 2.5 (calc) (ref <5.0)
Triglycerides: 84 mg/dL (ref <150)

## 2019-08-27 LAB — HEMOGLOBIN A1C
Hgb A1c MFr Bld: 5.8 % of total Hgb — ABNORMAL HIGH (ref <5.7)
Mean Plasma Glucose: 120 (calc)
eAG (mmol/L): 6.6 (calc)

## 2019-08-27 LAB — VITAMIN B12: Vitamin B-12: 361 pg/mL (ref 200–1100)

## 2019-08-27 LAB — VITAMIN D 25 HYDROXY (VIT D DEFICIENCY, FRACTURES): Vit D, 25-Hydroxy: 25 ng/mL — ABNORMAL LOW (ref 30–100)

## 2019-09-26 ENCOUNTER — Other Ambulatory Visit: Payer: Self-pay | Admitting: Internal Medicine

## 2019-09-26 DIAGNOSIS — E039 Hypothyroidism, unspecified: Secondary | ICD-10-CM

## 2019-10-06 ENCOUNTER — Encounter: Payer: Medicare Other | Admitting: Internal Medicine

## 2019-10-19 ENCOUNTER — Telehealth: Payer: Self-pay | Admitting: Internal Medicine

## 2019-10-19 ENCOUNTER — Other Ambulatory Visit: Payer: Self-pay | Admitting: Internal Medicine

## 2019-10-19 DIAGNOSIS — E039 Hypothyroidism, unspecified: Secondary | ICD-10-CM

## 2019-10-19 MED ORDER — LEVOTHYROXINE SODIUM 150 MCG PO TABS: ORAL_TABLET | ORAL | 0 refills | Status: DC

## 2019-10-19 NOTE — Telephone Encounter (Signed)
EUTHYROX 150 MCG tablet  [place], [address] Phone:  [phone]  Fax:  [phone]     Next appt: 9.13.21 with Dr. Ronnald Ramp

## 2019-10-25 ENCOUNTER — Ambulatory Visit (INDEPENDENT_AMBULATORY_CARE_PROVIDER_SITE_OTHER): Payer: Medicare Other | Admitting: Internal Medicine

## 2019-10-25 ENCOUNTER — Other Ambulatory Visit: Payer: Self-pay

## 2019-10-25 ENCOUNTER — Encounter: Payer: Self-pay | Admitting: Internal Medicine

## 2019-10-25 VITALS — BP 152/84 | HR 102 | Temp 98.1°F | Resp 16 | Ht 72.0 in | Wt 230.0 lb

## 2019-10-25 DIAGNOSIS — N401 Enlarged prostate with lower urinary tract symptoms: Secondary | ICD-10-CM

## 2019-10-25 DIAGNOSIS — Z23 Encounter for immunization: Secondary | ICD-10-CM | POA: Diagnosis not present

## 2019-10-25 DIAGNOSIS — I10 Essential (primary) hypertension: Secondary | ICD-10-CM

## 2019-10-25 DIAGNOSIS — R3911 Hesitancy of micturition: Secondary | ICD-10-CM | POA: Diagnosis not present

## 2019-10-25 DIAGNOSIS — E039 Hypothyroidism, unspecified: Secondary | ICD-10-CM

## 2019-10-25 MED ORDER — SHINGRIX 50 MCG/0.5ML IM SUSR: 0.5000 mL | Freq: Once | INTRAMUSCULAR | 1 refills | Status: AC

## 2019-10-25 MED ORDER — NEBIVOLOL HCL 5 MG PO TABS: 5.0000 mg | ORAL_TABLET | Freq: Every day | ORAL | 0 refills | Status: DC

## 2019-10-25 NOTE — Patient Instructions (Signed)
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
## 2019-10-26 LAB — BASIC METABOLIC PANEL WITH GFR
BUN: 15 mg/dL (ref 7–25)
CO2: 24 mmol/L (ref 20–32)
Calcium: 9.8 mg/dL (ref 8.6–10.3)
Chloride: 106 mmol/L (ref 98–110)
Creat: 1.05 mg/dL (ref 0.70–1.33)
GFR, Est African American: 91 mL/min/{1.73_m2} (ref >=60)
GFR, Est Non African American: 78 mL/min/{1.73_m2} (ref >=60)
Glucose, Bld: 94 mg/dL (ref 65–99)
Potassium: 4.2 mmol/L (ref 3.5–5.3)
Sodium: 142 mmol/L (ref 135–146)

## 2019-10-26 LAB — TSH: TSH: 2.35 mIU/L (ref 0.40–4.50)

## 2019-10-26 MED ORDER — LEVOTHYROXINE SODIUM 150 MCG PO TABS: ORAL_TABLET | ORAL | 1 refills | Status: DC

## 2019-11-12 ENCOUNTER — Other Ambulatory Visit: Payer: Self-pay | Admitting: Internal Medicine

## 2019-11-19 ENCOUNTER — Other Ambulatory Visit: Payer: Self-pay | Admitting: Internal Medicine

## 2019-11-19 DIAGNOSIS — E039 Hypothyroidism, unspecified: Secondary | ICD-10-CM

## 2019-12-19 ENCOUNTER — Other Ambulatory Visit: Payer: Self-pay | Admitting: Internal Medicine

## 2019-12-19 DIAGNOSIS — E039 Hypothyroidism, unspecified: Secondary | ICD-10-CM

## 2019-12-20 ENCOUNTER — Ambulatory Visit: Payer: Medicare Other | Admitting: Internal Medicine

## 2019-12-29 ENCOUNTER — Encounter: Payer: Self-pay | Admitting: Internal Medicine

## 2019-12-29 ENCOUNTER — Ambulatory Visit (INDEPENDENT_AMBULATORY_CARE_PROVIDER_SITE_OTHER): Payer: Medicare Other | Admitting: Internal Medicine

## 2019-12-29 ENCOUNTER — Other Ambulatory Visit: Payer: Self-pay

## 2019-12-29 VITALS — BP 142/102 | HR 79 | Temp 98.0°F | Ht 72.0 in | Wt 241.0 lb

## 2019-12-29 DIAGNOSIS — E039 Hypothyroidism, unspecified: Secondary | ICD-10-CM

## 2019-12-29 DIAGNOSIS — I10 Essential (primary) hypertension: Secondary | ICD-10-CM | POA: Diagnosis not present

## 2019-12-29 DIAGNOSIS — F1010 Alcohol abuse, uncomplicated: Secondary | ICD-10-CM

## 2019-12-29 MED ORDER — LEVOTHYROXINE SODIUM 150 MCG PO TABS: 150.0000 ug | ORAL_TABLET | Freq: Every day | ORAL | 3 refills | Status: DC

## 2019-12-29 MED ORDER — NEBIVOLOL HCL 10 MG PO TABS: 10.0000 mg | ORAL_TABLET | Freq: Every day | ORAL | 1 refills | Status: DC

## 2019-12-29 MED ORDER — LISINOPRIL-HYDROCHLOROTHIAZIDE 10-12.5 MG PO TABS
1.0000 | ORAL_TABLET | Freq: Every day | ORAL | 3 refills | Status: DC
Start: 2019-12-29 — End: 2021-01-08

## 2019-12-29 MED ORDER — SIMVASTATIN 40 MG PO TABS
40.0000 mg | ORAL_TABLET | Freq: Every day | ORAL | 3 refills | Status: DC
Start: 2019-12-29 — End: 2021-03-13

## 2019-12-29 NOTE — Progress Notes (Signed)
   Subjective:   Patient ID: Mark Booth, male    DOB: [birth year], 59 y.o.   MRN: [id number]  HPI The patient is a 59 YO man coming in for follow up of his blood pressure. He is taking his medications as prescribed. Sometimes gets headaches. He is drinking alcohol more than he knows he should and feels he may have a problem with it. Not sure about stopping altogether at this time. 6 pack beer daily when drinking typically. Started on bystolic a month or so ago and this has not had any side effects.   Review of Systems  Constitutional: Negative.   HENT: Negative.   Eyes: Negative.   Respiratory: Negative for cough, chest tightness and shortness of breath.   Cardiovascular: Negative for chest pain, palpitations and leg swelling.  Gastrointestinal: Negative for abdominal distention, abdominal pain, constipation, diarrhea, nausea and vomiting.  Musculoskeletal: Negative.   Skin: Negative.   Neurological: Positive for headaches.  Psychiatric/Behavioral: Negative.     Objective:  Physical Exam Constitutional:      Appearance: He is well-developed. He is obese.  HENT:     Head: Normocephalic and atraumatic.  Cardiovascular:     Rate and Rhythm: Normal rate and regular rhythm.  Pulmonary:     Effort: Pulmonary effort is normal. No respiratory distress.     Breath sounds: Normal breath sounds. No wheezing or rales.  Abdominal:     General: Bowel sounds are normal. There is no distension.     Palpations: Abdomen is soft.     Tenderness: There is no abdominal tenderness. There is no rebound.  Musculoskeletal:     Cervical back: Normal range of motion.  Skin:    General: Skin is warm and dry.  Neurological:     Mental Status: He is alert and oriented to person, place, and time.     Coordination: Coordination normal.     Vitals:   12/29/19 0952  BP: (!) 144/100  Pulse: 79  Temp: 98 F (36.7 C)  TempSrc: Oral  SpO2: 98%  Weight: 241 lb (109.3 kg)  Height: 6' (1.829 m)     This visit occurred during the SARS-CoV-2 public health emergency.  Safety protocols were in place, including screening questions prior to the visit, additional usage of staff PPE, and extensive cleaning of exam room while observing appropriate contact time as indicated for disinfecting solutions.   Assessment & Plan:

## 2019-12-29 NOTE — Patient Instructions (Signed)
We have sent in the refills and have increased the bystolic to 10 mg daily. We have sent in the higher dose of that medicine.

## 2019-12-31 NOTE — Assessment & Plan Note (Signed)
BP persistently elevated. Keep lisinopril/hctz same and increase bystolic to 10 mg daily as HR is acceptable. We talked about alcohol cessation as a treatment option for BP management as well and the fact that his drinking is above safe levels.

## 2019-12-31 NOTE — Assessment & Plan Note (Signed)
Admits to drinking more than intended at times and knowing that he should cut back. Not sure on making an attempt at this time.

## 2020-02-14 ENCOUNTER — Other Ambulatory Visit: Payer: Self-pay | Admitting: Internal Medicine

## 2020-04-11 ENCOUNTER — Ambulatory Visit: Payer: Medicare Other

## 2020-04-25 ENCOUNTER — Other Ambulatory Visit: Payer: Self-pay

## 2020-04-25 ENCOUNTER — Ambulatory Visit (INDEPENDENT_AMBULATORY_CARE_PROVIDER_SITE_OTHER): Payer: Medicare Other

## 2020-04-25 VITALS — BP 124/88 | HR 71 | Temp 98.2°F | Ht 72.0 in | Wt 254.2 lb

## 2020-04-25 DIAGNOSIS — Z0000 Encounter for general adult medical examination without abnormal findings: Secondary | ICD-10-CM

## 2020-04-25 NOTE — Progress Notes (Signed)
Subjective:   Mark Booth is a 60 y.o. male who presents for Medicare Annual/Subsequent preventive examination.  Review of Systems    No ROS. Medicare Wellness Visit. Additional risk factors are reflected in social history. Cardiac Risk Factors include: advanced age (>39men, >42 women);dyslipidemia;hypertension;male gender;obesity (BMI >30kg/m2);sedentary lifestyle     Objective:    Today's Vitals   04/25/20 1258  BP: 124/88  Pulse: 71  Temp: 98.2 F (36.8 C)  SpO2: 97%  Weight: 254 lb 3.2 oz (115.3 kg)  Height: 6' (1.829 m)  PainSc: 0-No pain   Body mass index is 34.48 kg/m.  Advanced Directives 04/25/2020 01/19/2014  Does Patient Have a Medical Advance Directive? No No  Would patient like information on creating a medical advance directive? No - Patient declined No - patient declined information    Current Medications (verified) Outpatient Encounter Medications as of 04/25/2020  Medication Sig  . albuterol (VENTOLIN HFA) 108 (90 Base) MCG/ACT inhaler INHALE 2 PUFFS BY MOUTH EVERY 6 HOURS AS NEEDED  . ARIPiprazole (ABILIFY) 10 MG tablet Take 10 mg by mouth daily.  . fenofibrate 160 MG tablet Take 1 tablet (160 mg total) by mouth daily.  . Fluticasone-Salmeterol (ADVAIR) 100-50 MCG/DOSE AEPB Inhale 1 puff into the lungs 2 (two) times daily.  Marland Kitchen lamoTRIgine (LAMICTAL) 100 MG tablet Take 100 mg by mouth daily.  Marland Kitchen levothyroxine (EUTHYROX) 150 MCG tablet Take 1 tablet (150 mcg total) by mouth daily before breakfast.  . lisinopril-hydrochlorothiazide (ZESTORETIC) 10-12.5 MG tablet Take 1 tablet by mouth daily.  . nebivolol (BYSTOLIC) 10 MG tablet Take 1 tablet (10 mg total) by mouth daily.  . simvastatin (ZOCOR) 40 MG tablet Take 1 tablet (40 mg total) by mouth daily at 6 PM.  . [DISCONTINUED] alfuzosin (UROXATRAL) 10 MG 24 hr tablet Take 1 tablet (10 mg total) by mouth daily with breakfast. (Patient not taking: Reported on 04/25/2020)  . [DISCONTINUED] TRINTELLIX 20 MG TABS  tablet TAKE 1 TABLET BY MOUTH ONCE DAILY IN THE MORNING WITH FOOD   No facility-administered encounter medications on file as of 04/25/2020.    Allergies (verified) Patient has no known allergies.   History: Past Medical History:  Diagnosis Date  . Allergic rhinitis   . Allergy    AS A CHILD  . Asthma   . Bipolar disorder ([place])   . Cataract    BILATERAL-REMOVED  . Depression   . Hyperlipidemia   . Hypertension   . Hypothyroidism   . Osteoarthritis   . Overweight(278.02)   . Unspecified vitamin D deficiency    Past Surgical History:  Procedure Laterality Date  . APPENDECTOMY    . CATARACT EXTRACTION  12-2012   BILATERAL  . COLONOSCOPY    . POLYPECTOMY    . SHOULDER SURGERY  2013   Family History  Problem Relation Age of Onset  . Osteoporosis Mother   . Depression Mother   . Diabetes Mother   . Hypothyroidism Mother   . Pancreatic cancer Mother   . Prostate cancer Father   . Colon cancer Paternal Grandfather   . Rectal cancer Neg Hx   . Stomach cancer Neg Hx   . Esophageal cancer Neg Hx    Social History   Socioeconomic History  . Marital status: Single    Spouse name: Not on file  . Number of children: Not on file  . Years of education: Not on file  . Highest education level: Not on file  Occupational History  . Occupation:  Unemployed  Tobacco Use  . Smoking status: Never Smoker  . Smokeless tobacco: Never Used  Vaping Use  . Vaping Use: Never used  Substance and Sexual Activity  . Alcohol use: Yes    Alcohol/week: 42.0 standard drinks    Types: 42 Cans of beer per week  . Drug use: No  . Sexual activity: Not Currently  Other Topics Concern  . Not on file  Social History Narrative  . Not on file   Social Determinants of Health   Financial Resource Strain: Low Risk   . Difficulty of Paying Living Expenses: Not hard at all  Food Insecurity: No Food Insecurity  . Worried About Charity fundraiser in the Last Year: Never true  . Ran Out of  Food in the Last Year: Never true  Transportation Needs: No Transportation Needs  . Lack of Transportation (Medical): No  . Lack of Transportation (Non-Medical): No  Physical Activity: Inactive  . Days of Exercise per Week: 0 days  . Minutes of Exercise per Session: 0 min  Stress: No Stress Concern Present  . Feeling of Stress : Not at all  Social Connections: Socially Isolated  . Frequency of Communication with Friends and Family: More than three times a week  . Frequency of Social Gatherings with Friends and Family: Once a week  . Attends Religious Services: Never  . Active Member of Clubs or Organizations: No  . Attends Archivist Meetings: Never  . Marital Status: Never married    Tobacco Counseling Counseling given: Not Answered   Clinical Intake:  Pre-visit preparation completed: Yes  Pain : No/denies pain Pain Score: 0-No pain     BMI - recorded: 34.48 Nutritional Status: BMI > 30  Obese Nutritional Risks: None Diabetes: No  How often do you need to have someone help you when you read instructions, pamphlets, or other written materials from your doctor or pharmacy?: 1 - Never What is the last grade level you completed in school?: Ph.D  Diabetic? no  Interpreter Needed?: No  Information entered by :: Shenika N. Hatfield, LPN   Activities of Daily Living In your present state of health, do you have any difficulty performing the following activities: 04/25/2020  Hearing? N  Vision? N  Difficulty concentrating or making decisions? N  Walking or climbing stairs? N  Dressing or bathing? N  Doing errands, shopping? N  Preparing Food and eating ? N  Using the Toilet? N  In the past six months, have you accidently leaked urine? N  Do you have problems with loss of bowel control? N  Managing your Medications? N  Managing your Finances? N  Housekeeping or managing your Housekeeping? N  Some recent data might be hidden    Patient Care  Team: Hoyt Koch, MD as PCP - General (Internal Medicine) Charlton Haws, [facility] as Pharmacist (Pharmacist)  Indicate any recent Medical Services you may have received from other than [facility] providers in the past year (date may be approximate).     Assessment:   This is a routine wellness examination for Mark Booth.  Hearing/Vision screen No exam data present  Dietary issues and exercise activities discussed: Current Exercise Habits: The patient does not participate in regular exercise at present, Exercise limited by: respiratory conditions(s);psychological condition(s)  Goals   None    Depression Screen PHQ 2/9 Scores 04/25/2020 10/05/2018  PHQ - 2 Score 4 1    Fall Risk Fall Risk  04/25/2020 06/17/2019  Falls in  the past year? 1 0  Number falls in past yr: 0 0  Injury with Fall? 0 0  Risk for fall due to : No Fall Risks -  Follow up Falls evaluation completed -    FALL RISK PREVENTION PERTAINING TO THE HOME:  Any stairs in or around the home? No  If so, are there any without handrails? No  Home free of loose throw rugs in walkways, pet beds, electrical cords, etc? Yes  Adequate lighting in your home to reduce risk of falls? Yes   ASSISTIVE DEVICES UTILIZED TO PREVENT FALLS:  Life alert? No  Use of a cane, walker or w/c? No  Grab bars in the bathroom? No  Shower chair or bench in shower? No  Elevated toilet seat or a handicapped toilet? No   TIMED UP AND GO:  Was the test performed? No .  Length of time to ambulate 10 feet: 0 sec.   Gait steady and fast without use of assistive device  Cognitive Function: Normal cognitive status assessed by direct observation by this Nurse Health Advisor. No abnormalities found.          Immunizations Immunization History  Administered Date(s) Administered  . Influenza,inj,Quad PF,6+ Mos 04/24/2018, 10/25/2019  . Moderna Sars-Covid-2 Vaccination 06/22/2019, 07/20/2019  . Zoster Recombinat (Shingrix) 10/28/2019     TDAP status: Due, Education has been provided regarding the importance of this vaccine. Advised may receive this vaccine at local pharmacy or Health Dept. Aware to provide a copy of the vaccination record if obtained from local pharmacy or Health Dept. Verbalized acceptance and understanding.  Flu Vaccine status: Up to date  Pneumococcal vaccine status: Declined,  Education has been provided regarding the importance of this vaccine but patient still declined. Advised may receive this vaccine at local pharmacy or Health Dept. Aware to provide a copy of the vaccination record if obtained from local pharmacy or Health Dept. Verbalized acceptance and understanding.   Covid-19 vaccine status: Completed vaccines  Qualifies for Shingles Vaccine? Yes   Zostavax completed No   Shingrix Completed?: Yes  Screening Tests Health Maintenance  Topic Date Due  . Hepatitis C Screening  Never done  . TETANUS/TDAP  Never done  . COVID-19 Vaccine (3 - Booster for Moderna series) 01/19/2020  . COLONOSCOPY (Pts 45-24yrs Insurance coverage will need to be confirmed)  11/18/2023  . INFLUENZA VACCINE  Completed  . HIV Screening  Completed  . HPV VACCINES  Aged Out    Health Maintenance  Health Maintenance Due  Topic Date Due  . Hepatitis C Screening  Never done  . TETANUS/TDAP  Never done  . COVID-19 Vaccine (3 - Booster for Moderna series) 01/19/2020    Colorectal cancer screening: Type of screening: Colonoscopy. Completed 11/18/2018. Repeat every 5 years  Lung Cancer Screening: (Low Dose CT Chest recommended if Age 17-80 years, 30 pack-year currently smoking OR have quit w/in 15years.) does not qualify.   Lung Cancer Screening Referral: no  Additional Screening:  Hepatitis C Screening: does qualify; Completed no  Vision Screening: Recommended annual ophthalmology exams for early detection of glaucoma and other disorders of the eye. Is the patient up to date with their annual eye exam?  Yes   Who is the provider or what is the name of the office in which the patient attends annual eye exams? Marshall Cork, MD. If pt is not established with a provider, would they like to be referred to a provider to establish care? No .  Dental Screening: Recommended annual dental exams for proper oral hygiene  Community Resource Referral / Chronic Care Management: CRR required this visit?  No   CCM required this visit?  No      Plan:     I have personally reviewed and noted the following in the patient's chart:   . Medical and social history . Use of alcohol, tobacco or illicit drugs  . Current medications and supplements . Functional ability and status . Nutritional status . Physical activity . Advanced directives . List of other physicians . Hospitalizations, surgeries, and ER visits in previous 12 months . Vitals . Screenings to include cognitive, depression, and falls . Referrals and appointments  In addition, I have reviewed and discussed with patient certain preventive protocols, quality metrics, and best practice recommendations. A written personalized care plan for preventive services as well as general preventive health recommendations were provided to patient.     Sheral Flow, LPN   08/13/4033   Nurse Notes:  Medications reviewed with patient; no opioid use noted.

## 2020-04-25 NOTE — Patient Instructions (Signed)
[name] , Thank you for taking time to come for your Medicare Wellness Visit. I appreciate your ongoing commitment to your health goals. Please review the following plan we discussed and let me know if I can assist you in the future.   Screening recommendations/referrals: Colonoscopy: 11/18/2018; due every 5 years Recommended yearly ophthalmology/optometry visit for glaucoma screening and checkup Recommended yearly dental visit for hygiene and checkup  Vaccinations: Influenza vaccine: 10/25/2019 Pneumococcal vaccine: declined Tdap vaccine: declined Shingles vaccine: 10/28/2019; need second dose   Covid-19: 06/22/2019, 07/20/2019  Advanced directives: Advance directive discussed with you today. Even though you declined this today please call our office should you change your mind and we can give you the proper paperwork for you to fill out.  Conditions/risks identified: Yes; Reviewed health maintenance screenings with patient today and relevant education, vaccines, and/or referrals were provided. Please continue to do your personal lifestyle choices by: daily care of teeth and gums, regular physical activity (goal should be 5 days a week for 30 minutes), eat a healthy diet, avoid tobacco and drug use, limiting any alcohol intake, taking a low-dose aspirin (if not allergic or have been advised by your provider otherwise) and taking vitamins and minerals as recommended by your provider. Continue doing brain stimulating activities (puzzles, reading, adult coloring books, staying active) to keep memory sharp. Continue to eat heart healthy diet (full of fruits, vegetables, whole grains, lean protein, water--limit salt, fat, and sugar intake) and increase physical activity as tolerated.  Next appointment: Please schedule your next Medicare Wellness Visit with your Nurse Health Advisor in 1 year by calling [phone].  Preventive Care 40-64 Years, Male Preventive care refers to lifestyle choices and  visits with your health care provider that can promote health and wellness. What does preventive care include?  A yearly physical exam. This is also called an annual well check.  Dental exams once or twice a year.  Routine eye exams. Ask your health care provider how often you should have your eyes checked.  Personal lifestyle choices, including:  Daily care of your teeth and gums.  Regular physical activity.  Eating a healthy diet.  Avoiding tobacco and drug use.  Limiting alcohol use.  Practicing safe sex.  Taking low-dose aspirin every day starting at age 53. What happens during an annual well check? The services and screenings done by your health care provider during your annual well check will depend on your age, overall health, lifestyle risk factors, and family history of disease. Counseling  Your health care provider may ask you questions about your:  Alcohol use.  Tobacco use.  Drug use.  Emotional well-being.  Home and relationship well-being.  Sexual activity.  Eating habits.  Work and work Statistician. Screening  You may have the following tests or measurements:  Height, weight, and BMI.  Blood pressure.  Lipid and cholesterol levels. These may be checked every 5 years, or more frequently if you are over 21 years old.  Skin check.  Lung cancer screening. You may have this screening every year starting at age 41 if you have a 30-pack-year history of smoking and currently smoke or have quit within the past 15 years.  Fecal occult blood test (FOBT) of the stool. You may have this test every year starting at age 3.  Flexible sigmoidoscopy or colonoscopy. You may have a sigmoidoscopy every 5 years or a colonoscopy every 10 years starting at age 3.  Prostate cancer screening. Recommendations will vary depending on your family  history and other risks.  Hepatitis C blood test.  Hepatitis B blood test.  Sexually transmitted disease (STD)  testing.  Diabetes screening. This is done by checking your blood sugar (glucose) after you have not eaten for a while (fasting). You may have this done every 1-3 years. Discuss your test results, treatment options, and if necessary, the need for more tests with your health care provider. Vaccines  Your health care provider may recommend certain vaccines, such as:  Influenza vaccine. This is recommended every year.  Tetanus, diphtheria, and acellular pertussis (Tdap, Td) vaccine. You may need a Td booster every 10 years.  Zoster vaccine. You may need this after age 27.  Pneumococcal 13-valent conjugate (PCV13) vaccine. You may need this if you have certain conditions and have not been vaccinated.  Pneumococcal polysaccharide (PPSV23) vaccine. You may need one or two doses if you smoke cigarettes or if you have certain conditions. Talk to your health care provider about which screenings and vaccines you need and how often you need them. This information is not intended to replace advice given to you by your health care provider. Make sure you discuss any questions you have with your health care provider. Document Released: 02/24/2015 Document Revised: 10/18/2015 Document Reviewed: 11/29/2014 Elsevier Interactive Patient Education  2017 [place] Prevention in the Home Falls can cause injuries. They can happen to people of all ages. There are many things you can do to make your home safe and to help prevent falls. What can I do on the outside of my home?  Regularly fix the edges of walkways and driveways and fix any cracks.  Remove anything that might make you trip as you walk through a door, such as a raised step or threshold.  Trim any bushes or trees on the path to your home.  Use bright outdoor lighting.  Clear any walking paths of anything that might make someone trip, such as rocks or tools.  Regularly check to see if handrails are loose or broken. Make sure that  both sides of any steps have handrails.  Any raised decks and porches should have guardrails on the edges.  Have any leaves, snow, or ice cleared regularly.  Use sand or salt on walking paths during winter.  Clean up any spills in your garage right away. This includes oil or grease spills. What can I do in the bathroom?  Use night lights.  Install grab bars by the toilet and in the tub and shower. Do not use towel bars as grab bars.  Use non-skid mats or decals in the tub or shower.  If you need to sit down in the shower, use a plastic, non-slip stool.  Keep the floor dry. Clean up any water that spills on the floor as soon as it happens.  Remove soap buildup in the tub or shower regularly.  Attach bath mats securely with double-sided non-slip rug tape.  Do not have throw rugs and other things on the floor that can make you trip. What can I do in the bedroom?  Use night lights.  Make sure that you have a light by your bed that is easy to reach.  Do not use any sheets or blankets that are too big for your bed. They should not hang down onto the floor.  Have a firm chair that has side arms. You can use this for support while you get dressed.  Do not have throw rugs and other things on the  floor that can make you trip. What can I do in the kitchen?  Clean up any spills right away.  Avoid walking on wet floors.  Keep items that you use a lot in easy-to-reach places.  If you need to reach something above you, use a strong step stool that has a grab bar.  Keep electrical cords out of the way.  Do not use floor polish or wax that makes floors slippery. If you must use wax, use non-skid floor wax.  Do not have throw rugs and other things on the floor that can make you trip. What can I do with my stairs?  Do not leave any items on the stairs.  Make sure that there are handrails on both sides of the stairs and use them. Fix handrails that are broken or loose. Make sure  that handrails are as long as the stairways.  Check any carpeting to make sure that it is firmly attached to the stairs. Fix any carpet that is loose or worn.  Avoid having throw rugs at the top or bottom of the stairs. If you do have throw rugs, attach them to the floor with carpet tape.  Make sure that you have a light switch at the top of the stairs and the bottom of the stairs. If you do not have them, ask someone to add them for you. What else can I do to help prevent falls?  Wear shoes that:  Do not have high heels.  Have rubber bottoms.  Are comfortable and fit you well.  Are closed at the toe. Do not wear sandals.  If you use a stepladder:  Make sure that it is fully opened. Do not climb a closed stepladder.  Make sure that both sides of the stepladder are locked into place.  Ask someone to hold it for you, if possible.  Clearly mark and make sure that you can see:  Any grab bars or handrails.  First and last steps.  Where the edge of each step is.  Use tools that help you move around (mobility aids) if they are needed. These include:  Canes.  Walkers.  Scooters.  Crutches.  Turn on the lights when you go into a dark area. Replace any light bulbs as soon as they burn out.  Set up your furniture so you have a clear path. Avoid moving your furniture around.  If any of your floors are uneven, fix them.  If there are any pets around you, be aware of where they are.  Review your medicines with your doctor. Some medicines can make you feel dizzy. This can increase your chance of falling. Ask your doctor what other things that you can do to help prevent falls. This information is not intended to replace advice given to you by your health care provider. Make sure you discuss any questions you have with your health care provider. Document Released: 11/24/2008 Document Revised: 07/06/2015 Document Reviewed: 03/04/2014 Elsevier Interactive Patient Education   2017 [employer].

## 2020-05-14 ENCOUNTER — Other Ambulatory Visit: Payer: Self-pay | Admitting: Internal Medicine

## 2020-06-23 ENCOUNTER — Encounter: Payer: Self-pay | Admitting: Internal Medicine

## 2020-06-23 ENCOUNTER — Telehealth (INDEPENDENT_AMBULATORY_CARE_PROVIDER_SITE_OTHER): Payer: Medicare Other | Admitting: Internal Medicine

## 2020-06-23 ENCOUNTER — Other Ambulatory Visit: Payer: Self-pay

## 2020-06-23 DIAGNOSIS — J4531 Mild persistent asthma with (acute) exacerbation: Secondary | ICD-10-CM

## 2020-06-23 DIAGNOSIS — J069 Acute upper respiratory infection, unspecified: Secondary | ICD-10-CM | POA: Diagnosis not present

## 2020-06-23 MED ORDER — PREDNISONE 20 MG PO TABS: 40.0000 mg | ORAL_TABLET | Freq: Every day | ORAL | 0 refills | Status: DC

## 2020-06-23 MED ORDER — MONTELUKAST SODIUM 10 MG PO TABS: 10.0000 mg | ORAL_TABLET | Freq: Every day | ORAL | 0 refills | Status: DC

## 2020-06-23 NOTE — Progress Notes (Signed)
Virtual Visit via Audio Note  I connected with Mark Booth on 06/23/20 at  8:40 AM EDT by an audio-only enabled telemedicine application and verified that I am speaking with the correct person using two identifiers.  The patient and the provider were at separate locations throughout the entire encounter. Patient location: home, Provider location: work   I discussed the limitations of evaluation and management by telemedicine and the availability of in person appointments. The patient expressed understanding and agreed to proceed. The patient and the provider were the only parties present for the visit unless noted in HPI below.  History of Present Illness: The patient is a 60 y.o. man with visit for cold symptoms. Started about 8 days ago. Has cough and headaches and nasal congestion and drainage. Is wheezing also and sometimes hard to breathe. Having SOB with exertion more than usual. Denies fevers or chills. Overall it is about the same as onset. Denies sick contacts. Still taking advair and using albuterol more than usual. This does help but only for a few minutes. Has tried his usual albuterol and advair. Not taking any allergy medication lately. Has had 3 covid-19 shots. Has not had any covid-19 tests done since this started.   Observations/Objective: A and O times 3, no coughing during visit  Assessment and Plan: See problem oriented charting  Follow Up Instructions: rx prednisone, increase advair to 2 puffs BID for 1-2 weeks, rx singulair to help with nasal congestion  Visit time 11 minutes in non-face to face communication with patient and coordination of care.   I discussed the assessment and treatment plan with the patient. The patient was provided an opportunity to ask questions and all were answered. The patient agreed with the plan and demonstrated an understanding of the instructions.   The patient was advised to call back or seek an in-person evaluation if the symptoms worsen  or if the condition fails to improve as anticipated.  Mark Koch, MD

## 2020-06-23 NOTE — Assessment & Plan Note (Signed)
With flare today. Increase advair to 2 puffs BID and rx prednisone. Rx singulair also.

## 2020-06-23 NOTE — Assessment & Plan Note (Signed)
Could be covid-19 or other viral URI. Outside window for anti-viral treatment so testing would not alter management. Rx prednisone and increase advair to 2 puffs BID for 1-2 weeks. Rx singulair.

## 2020-06-27 ENCOUNTER — Ambulatory Visit: Payer: Medicare Other | Admitting: Internal Medicine

## 2020-06-28 ENCOUNTER — Other Ambulatory Visit: Payer: Self-pay | Admitting: Internal Medicine

## 2020-06-28 DIAGNOSIS — I10 Essential (primary) hypertension: Secondary | ICD-10-CM

## 2020-07-03 ENCOUNTER — Telehealth: Payer: Self-pay | Admitting: Internal Medicine

## 2020-07-03 MED ORDER — FLUTICASONE-SALMETEROL 100-50 MCG/ACT IN AEPB: 1.0000 | INHALATION_SPRAY | Freq: Two times a day (BID) | RESPIRATORY_TRACT | 5 refills | Status: DC

## 2020-07-03 NOTE — Telephone Encounter (Signed)
Fine to refill 6 months

## 2020-07-03 NOTE — Telephone Encounter (Signed)
Ok to refill? Please advise.  

## 2020-07-03 NOTE — Telephone Encounter (Signed)
   Patient calling to request Fluticasone-Salmeterol (ADVAIR) 100-50 MCG/DOSE [place], [place]

## 2020-07-03 NOTE — Telephone Encounter (Signed)
Medication has been sent to the patient's pharmacy.  

## 2020-07-13 ENCOUNTER — Other Ambulatory Visit: Payer: Self-pay | Admitting: Internal Medicine

## 2020-07-15 ENCOUNTER — Other Ambulatory Visit: Payer: Self-pay | Admitting: Internal Medicine

## 2020-07-17 MED ORDER — MONTELUKAST SODIUM 10 MG PO TABS: 10.0000 mg | ORAL_TABLET | Freq: Every day | ORAL | 0 refills | Status: DC

## 2020-08-15 ENCOUNTER — Other Ambulatory Visit: Payer: Self-pay | Admitting: Internal Medicine

## 2020-08-31 ENCOUNTER — Encounter: Payer: Self-pay | Admitting: Internal Medicine

## 2020-08-31 LAB — HM DIABETES EYE EXAM

## 2020-09-26 ENCOUNTER — Ambulatory Visit (INDEPENDENT_AMBULATORY_CARE_PROVIDER_SITE_OTHER): Payer: Medicare Other | Admitting: Internal Medicine

## 2020-09-26 ENCOUNTER — Encounter: Payer: Self-pay | Admitting: Internal Medicine

## 2020-09-26 ENCOUNTER — Other Ambulatory Visit: Payer: Self-pay

## 2020-09-26 VITALS — BP 132/70 | HR 64 | Temp 97.7°F | Resp 18 | Ht 72.0 in | Wt 242.6 lb

## 2020-09-26 DIAGNOSIS — R5383 Other fatigue: Secondary | ICD-10-CM

## 2020-09-26 DIAGNOSIS — E039 Hypothyroidism, unspecified: Secondary | ICD-10-CM

## 2020-09-26 DIAGNOSIS — I10 Essential (primary) hypertension: Secondary | ICD-10-CM | POA: Diagnosis not present

## 2020-09-26 DIAGNOSIS — E559 Vitamin D deficiency, unspecified: Secondary | ICD-10-CM

## 2020-09-26 DIAGNOSIS — E538 Deficiency of other specified B group vitamins: Secondary | ICD-10-CM | POA: Diagnosis not present

## 2020-09-26 DIAGNOSIS — R7301 Impaired fasting glucose: Secondary | ICD-10-CM | POA: Diagnosis not present

## 2020-09-26 DIAGNOSIS — F1010 Alcohol abuse, uncomplicated: Secondary | ICD-10-CM

## 2020-09-26 LAB — CBC
HCT: 40.6 % (ref 39.0–52.0)
Hemoglobin: 14.2 g/dL (ref 13.0–17.0)
MCHC: 34.8 g/dL (ref 30.0–36.0)
MCV: 89.4 fl (ref 78.0–100.0)
Platelets: 281 10*3/uL (ref 150.0–400.0)
RBC: 4.55 Mil/uL (ref 4.22–5.81)
RDW: 13.3 % (ref 11.5–15.5)
WBC: 9.5 10*3/uL (ref 4.0–10.5)

## 2020-09-26 LAB — VITAMIN D 25 HYDROXY (VIT D DEFICIENCY, FRACTURES): VITD: 30.28 ng/mL (ref 30.00–100.00)

## 2020-09-26 LAB — T4, FREE: Free T4: 1.35 ng/dL (ref 0.60–1.60)

## 2020-09-26 LAB — COMPREHENSIVE METABOLIC PANEL
ALT: 62 U/L — ABNORMAL HIGH (ref 0–53)
AST: 87 U/L — ABNORMAL HIGH (ref 0–37)
Albumin: 4.4 g/dL (ref 3.5–5.2)
Alkaline Phosphatase: 48 U/L (ref 39–117)
BUN: 14 mg/dL (ref 6–23)
CO2: 27 mEq/L (ref 19–32)
Calcium: 9.9 mg/dL (ref 8.4–10.5)
Chloride: 103 mEq/L (ref 96–112)
Creatinine, Ser: 1.11 mg/dL (ref 0.40–1.50)
GFR: 73.22 mL/min (ref >60.00)
Glucose, Bld: 134 mg/dL — ABNORMAL HIGH (ref 70–99)
Potassium: 4 mEq/L (ref 3.5–5.1)
Sodium: 138 mEq/L (ref 135–145)
Total Bilirubin: 0.5 mg/dL (ref 0.2–1.2)
Total Protein: 7.3 g/dL (ref 6.0–8.3)

## 2020-09-26 LAB — VITAMIN B12: Vitamin B-12: 374 pg/mL (ref 211–911)

## 2020-09-26 LAB — HEMOGLOBIN A1C: Hgb A1c MFr Bld: 7.1 % — ABNORMAL HIGH (ref 4.6–6.5)

## 2020-09-26 LAB — TSH: TSH: 0.72 u[IU]/mL (ref 0.35–5.50)

## 2020-09-26 NOTE — Progress Notes (Signed)
   Subjective:   Patient ID: Mark Booth, male    DOB: 1960-04-29, 60 y.o.   MRN: [id number]  HPI The patient is a 60 YO man coming in for fatigue. Going on for about a month or so. Denies illness at onset or medication changes. 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   Review of Systems  Constitutional:  Positive for fatigue.  HENT: Negative.    Eyes: Negative.   Respiratory:  Negative for cough, chest tightness and shortness of breath.   Cardiovascular:  Negative for chest pain, palpitations and leg swelling.  Gastrointestinal:  Negative for abdominal distention, abdominal pain, constipation, diarrhea, nausea and vomiting.  Musculoskeletal: Negative.   Skin: Negative.   Neurological: Negative.   Psychiatric/Behavioral: Negative.     Objective:  Physical Exam Constitutional:      Appearance: He is well-developed. He is obese.  HENT:     Head: Normocephalic and atraumatic.  Cardiovascular:     Rate and Rhythm: Normal rate and regular rhythm.  Pulmonary:     Effort: Pulmonary effort is normal. No respiratory distress.     Breath sounds: Normal breath sounds. No wheezing or rales.  Abdominal:     General: Bowel sounds are normal. There is no distension.     Palpations: Abdomen is soft.     Tenderness: There is no abdominal tenderness. There is no rebound.  Musculoskeletal:     Cervical back: Normal range of motion.  Skin:    General: Skin is warm and dry.  Neurological:     Mental Status: He is alert and oriented to person, place, and time.     Coordination: Coordination normal.    Vitals:   09/26/20 1356  BP: 132/70  Pulse: 64  Resp: 18  Temp: 97.7 F (36.5 C)  TempSrc: Oral  SpO2: 97%  Weight: 242 lb 9.6 oz (110 kg)  Height: 6' (1.829 m)    This visit occurred during the SARS-CoV-2 public health emergency.  Safety protocols were in place, including screening questions prior to the visit,  additional usage of staff PPE, and extensive cleaning of exam room while observing appropriate contact time as indicated for disinfecting solutions.   Assessment & Plan:

## 2020-09-26 NOTE — Assessment & Plan Note (Signed)
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

## 2020-09-26 NOTE — Patient Instructions (Signed)
We will check the labs today.  If they are all normal we will half the dose of the bystolic to 1/2 pill daily.

## 2020-09-26 NOTE — Assessment & Plan Note (Signed)
Checking thyroid levels and vitamin D and B12 and CBC and CMP to rule out metabolic cause of the fatigue. It is unclear if this is related to alcohol cessation. He is encouraged to maintain alcohol cessation.

## 2020-09-26 NOTE — Assessment & Plan Note (Signed)
Having symptoms of under replaced thyroid. Will check TSH and free T4 and adjust synthroid 150 mcg daily as needed.

## 2020-09-27 ENCOUNTER — Ambulatory Visit: Payer: Medicare Other | Admitting: Internal Medicine

## 2020-10-06 ENCOUNTER — Other Ambulatory Visit: Payer: Self-pay | Admitting: Internal Medicine

## 2020-10-06 DIAGNOSIS — I10 Essential (primary) hypertension: Secondary | ICD-10-CM

## 2020-10-10 ENCOUNTER — Ambulatory Visit: Payer: Medicare Other | Admitting: Internal Medicine

## 2020-10-26 ENCOUNTER — Other Ambulatory Visit: Payer: Self-pay

## 2020-10-27 ENCOUNTER — Ambulatory Visit: Payer: Medicare Other | Admitting: Internal Medicine

## 2020-11-02 ENCOUNTER — Ambulatory Visit (INDEPENDENT_AMBULATORY_CARE_PROVIDER_SITE_OTHER): Payer: Medicare Other | Admitting: Nurse Practitioner

## 2020-11-02 ENCOUNTER — Encounter: Payer: Self-pay | Admitting: Nurse Practitioner

## 2020-11-02 ENCOUNTER — Other Ambulatory Visit: Payer: Self-pay

## 2020-11-02 ENCOUNTER — Other Ambulatory Visit: Payer: Self-pay | Admitting: Nurse Practitioner

## 2020-11-02 VITALS — BP 122/82 | HR 65 | Temp 98.2°F | Ht 72.0 in | Wt 248.6 lb

## 2020-11-02 DIAGNOSIS — R748 Abnormal levels of other serum enzymes: Secondary | ICD-10-CM

## 2020-11-02 DIAGNOSIS — Z125 Encounter for screening for malignant neoplasm of prostate: Secondary | ICD-10-CM

## 2020-11-02 DIAGNOSIS — E1165 Type 2 diabetes mellitus with hyperglycemia: Secondary | ICD-10-CM | POA: Diagnosis not present

## 2020-11-02 DIAGNOSIS — R110 Nausea: Secondary | ICD-10-CM | POA: Diagnosis not present

## 2020-11-02 DIAGNOSIS — R5383 Other fatigue: Secondary | ICD-10-CM | POA: Diagnosis not present

## 2020-11-02 DIAGNOSIS — R3916 Straining to void: Secondary | ICD-10-CM

## 2020-11-02 LAB — COMPREHENSIVE METABOLIC PANEL
ALT: 44 U/L (ref 0–53)
AST: 38 U/L — ABNORMAL HIGH (ref 0–37)
Albumin: 4.3 g/dL (ref 3.5–5.2)
Alkaline Phosphatase: 48 U/L (ref 39–117)
BUN: 14 mg/dL (ref 6–23)
CO2: 28 mEq/L (ref 19–32)
Calcium: 9.4 mg/dL (ref 8.4–10.5)
Chloride: 106 mEq/L (ref 96–112)
Creatinine, Ser: 1.03 mg/dL (ref 0.40–1.50)
GFR: 80.04 mL/min (ref >60.00)
Glucose, Bld: 140 mg/dL — ABNORMAL HIGH (ref 70–99)
Potassium: 4.7 mEq/L (ref 3.5–5.1)
Sodium: 140 mEq/L (ref 135–145)
Total Bilirubin: 0.5 mg/dL (ref 0.2–1.2)
Total Protein: 6.7 g/dL (ref 6.0–8.3)

## 2020-11-02 LAB — PSA: PSA: 0.88 ng/mL (ref 0.10–4.00)

## 2020-11-02 MED ORDER — BLOOD GLUCOSE MONITOR KIT: PACK | 6 refills | Status: DC

## 2020-11-02 NOTE — Patient Instructions (Signed)
Fasting glucose goal: 80-130  2 hours after eating goal: <180

## 2020-11-02 NOTE — Progress Notes (Signed)
Subjective:  Patient ID: Mark Booth, male    DOB: Jun 18, 1960  Age: 60 y.o. MRN: [id number]  CC:  Chief Complaint  Patient presents with   Fatigue    Pt states since last visit he has been very fatigue. Having headaches in the morning w/ nausea   Headache   Other    Diabetes, elevated liver enzymes      HPI  This patient arrives today for the above.  Fatigue/headache: Last office visit with Dr. Sharlet Salina he had evaluation for fatigue.  Blood work showed elevated liver enzymes and diabetes with A1c of 7.1.  He tells me today he still continues to feel fatigue and headache and nausea about the headache and nausea only occurs in the morning and get better as the day goes on.  He tells me he usually has 1 large meal around dinnertime and otherwise has a small meal at lunch.  He is newly diagnosed diabetic.  He used to drink alcohol on a regular basis but tells me he has stopped drinking alcohol for approximately 3 months.  He is up-to-date with colon cancer screening he had a PSA level checked about 18 months ago at that point it was low.  He follows up with urology because he has overactive bladder symptoms, he tells me last time he had a prostate exam was about 4 months ago with his urologist and he was told nothing was abnormal on exam.   Diabetes: Newly diagnosed.  Not currently on any medication, not using glucometer.  A1c was 7.1.  Elevated liver enzymes: Found at last office visit on blood work.  He has not undergone ultrasound of the liver.  Past Medical History:  Diagnosis Date   Allergic rhinitis    Allergy    AS A CHILD   Asthma    Bipolar disorder ([place])    Cataract    BILATERAL-REMOVED   Depression    Hyperlipidemia    Hypertension    Hypothyroidism    Osteoarthritis    Overweight(278.02)    Unspecified vitamin D deficiency       Family History  Problem Relation Age of Onset   Osteoporosis Mother    Depression Mother    Diabetes Mother     Hypothyroidism Mother    Pancreatic cancer Mother    Prostate cancer Father    Colon cancer Paternal Grandfather    Rectal cancer Neg Hx    Stomach cancer Neg Hx    Esophageal cancer Neg Hx     Social History   Social History Narrative   Not on file   Social History   Tobacco Use   Smoking status: Never   Smokeless tobacco: Never  Substance Use Topics   Alcohol use: Yes    Alcohol/week: 42.0 standard drinks    Types: 42 Cans of beer per week     Current Meds  Medication Sig   blood glucose meter kit and supplies KIT Dispense based on patient and insurance preference. Use to check your blood sugar every morning before your first meal.    ROS:  Review of Systems  Constitutional:  Positive for malaise/fatigue. Negative for chills, diaphoresis, fever and weight loss.  Respiratory:  Negative for shortness of breath.   Cardiovascular:  Negative for chest pain and palpitations.  Gastrointestinal:  Positive for nausea. Negative for abdominal pain, blood in stool, constipation, diarrhea and vomiting.  Neurological:  Positive for dizziness and headaches.    Objective:   Today's  Vitals: BP 122/82 (BP Location: Left Arm)   Pulse 65   Temp 98.2 F (36.8 C) (Oral)   Ht 6' (1.829 m)   Wt 248 lb 9.6 oz (112.8 kg)   SpO2 97%   BMI 33.72 kg/m  Vitals with BMI 11/02/2020 09/26/2020 04/25/2020  Height '6\' 0"'  '6\' 0"'  '6\' 0"'   Weight 248 lbs 10 oz 242 lbs 10 oz 254 lbs 3 oz  BMI 33.71 60.7 37.10  Systolic 626 948 546  Diastolic 82 70 88  Pulse 65 64 71     Physical Exam Vitals reviewed.  Constitutional:      Appearance: Normal appearance.  HENT:     Head: Normocephalic and atraumatic.  Cardiovascular:     Rate and Rhythm: Normal rate and regular rhythm.  Pulmonary:     Effort: Pulmonary effort is normal.     Breath sounds: Normal breath sounds.  Abdominal:     General: Abdomen is flat. Bowel sounds are normal.     Palpations: Abdomen is soft. There is no hepatomegaly,  splenomegaly or mass.     Tenderness: There is no abdominal tenderness.  Musculoskeletal:     Cervical back: Neck supple.  Skin:    General: Skin is warm and dry.  Neurological:     Mental Status: He is alert and oriented to person, place, and time.  Psychiatric:        Mood and Affect: Mood normal.        Behavior: Behavior normal.        Thought Content: Thought content normal.        Judgment: Judgment normal.         Assessment and Plan   1. Nausea   2. Fatigue, unspecified type   3. Elevated liver enzymes   4. Type 2 diabetes mellitus with hyperglycemia, without long-term current use of insulin (HCC)   5. Screening for prostate cancer   6. Straining to void       Plan: 1.,  2.,  3.,  4., 5.,  6.  I think maybe his fatigue and nausea are most likely related to his new diabetes diagnosis.  We did discuss how to use a glucometer and he will be prescribed this today.  He will check it daily fasting blood sugar and keep a log of this until his next appointment.  May consider starting him on medication.  We also discussed dietary recommendations and he tells me he has made some changes in his eating more salads and try and eat healthier since last time his blood work was drawn.  I encouraged him to continue doing so.  I also recommended he eat meals throughout the day as his blood sugars may be labile due to him eating 1 large meal a day with small snacks in between.  We will check PSA and repeat CMP.  May consider getting ultrasound of the liver if enzymes remain elevated.  We will also refer to gastroenterology for further evaluation of his nausea.  Offered to prescribe Zofran that he can take as needed but for now he tells me he does not feel his nausea is severe enough to try medication.    Tests ordered Orders Placed This Encounter  Procedures   Comp Met (CMET)   PSA   Ambulatory referral to Gastroenterology      Meds ordered this encounter  Medications   blood  glucose meter kit and supplies KIT    Sig: Dispense based on patient and  insurance preference. Use to check your blood sugar every morning before your first meal.    Dispense:  1 each    Refill:  6    Please provider glucometer and strips per insurance    Order Specific Question:   Supervising Provider    Answer:   Binnie Rail [[id number]]    Order Specific Question:   Number of strips    Answer:   100    Order Specific Question:   Number of lancets    Answer:   100    Patient to follow-up in 4 weeks with Dr. Sharlet Salina with his blood sugar logs or sooner as needed.  Ailene Ards, NP

## 2020-11-12 ENCOUNTER — Other Ambulatory Visit: Payer: Self-pay | Admitting: Internal Medicine

## 2020-11-12 DIAGNOSIS — I10 Essential (primary) hypertension: Secondary | ICD-10-CM

## 2020-11-14 ENCOUNTER — Ambulatory Visit
Admission: RE | Admit: 2020-11-14 | Discharge: 2020-11-14 | Disposition: A | Discharge status: Home / Self Care | Payer: Medicare Other | Source: Ambulatory Visit | Attending: Nurse Practitioner | Admitting: Nurse Practitioner

## 2020-11-14 DIAGNOSIS — R748 Abnormal levels of other serum enzymes: Secondary | ICD-10-CM

## 2020-11-23 ENCOUNTER — Telehealth: Payer: Self-pay | Admitting: Internal Medicine

## 2020-11-23 DIAGNOSIS — E1165 Type 2 diabetes mellitus with hyperglycemia: Secondary | ICD-10-CM

## 2020-11-23 DIAGNOSIS — R748 Abnormal levels of other serum enzymes: Secondary | ICD-10-CM

## 2020-11-23 DIAGNOSIS — E119 Type 2 diabetes mellitus without complications: Secondary | ICD-10-CM | POA: Insufficient documentation

## 2020-11-23 DIAGNOSIS — R5383 Other fatigue: Secondary | ICD-10-CM

## 2020-11-23 DIAGNOSIS — R110 Nausea: Secondary | ICD-10-CM

## 2020-11-23 MED ORDER — BLOOD GLUCOSE MONITOR KIT
PACK | 6 refills | Status: DC
Start: 2020-11-23 — End: 2020-12-06

## 2020-11-23 NOTE — Telephone Encounter (Signed)
E11.9 dx code

## 2020-11-23 NOTE — Telephone Encounter (Signed)
Mark Booth w/[facility] pharmacy requesting Diagnosis code and new rx for blood glucose meter kit and supplies KIT  Please Call [phone]

## 2020-11-23 NOTE — Telephone Encounter (Signed)
New order sent to the patient's pharmacy.

## 2020-11-23 NOTE — Telephone Encounter (Signed)
Pharmacist called regarding blood glucose meter & supplies and test strips order. Requesting a new order due to the coding.      [place], [place] Phone:  [phone]  Fax:  [phone]

## 2020-11-23 NOTE — Telephone Encounter (Signed)
See below

## 2020-11-23 NOTE — Telephone Encounter (Signed)
Diagnosis code given to the pharmacy tech.

## 2020-11-27 NOTE — Telephone Encounter (Signed)
Pharmacist requesting separate rx w/instructions for blood glucose meter kit and supplies KIT  Pharmacist states medicare will not cover cost unless rx are separate  Pharmacist states medicare will not accept verbal diagnosis code for meter  Please advise [phone]

## 2020-11-29 NOTE — Telephone Encounter (Signed)
See below

## 2020-11-29 NOTE — Telephone Encounter (Signed)
Patient will need to call insurance then to see what meter is covered so we can send in proper rx

## 2020-11-29 NOTE — Telephone Encounter (Signed)
Patient calling to check status on diagnosis code

## 2020-12-01 ENCOUNTER — Telehealth: Payer: Self-pay | Admitting: Internal Medicine

## 2020-12-01 NOTE — Telephone Encounter (Signed)
Spoke with the patient and he stated that he will call his insurance and give our office a call back.

## 2020-12-01 NOTE — Telephone Encounter (Signed)
See below

## 2020-12-01 NOTE — Telephone Encounter (Signed)
Pt. States that [place] asked him to follow- up with insurance to see what type of blood glucose meter kit they will cover. Pt. States he has called insurance and they told him they will cover whatever kit that is prescribed.    Callback #- [phone]

## 2020-12-03 ENCOUNTER — Other Ambulatory Visit: Payer: Self-pay | Admitting: Internal Medicine

## 2020-12-04 ENCOUNTER — Telehealth: Payer: Self-pay | Admitting: Internal Medicine

## 2020-12-04 MED ORDER — BLOOD GLUCOSE METER KIT: PACK | 0 refills | Status: DC

## 2020-12-04 NOTE — Telephone Encounter (Signed)
Pharmacy called requesting separate rx for blood glucose test strips.  Pharmacy requesting directions w/ diagnostic code for glucose test strips rx  Pharmacy states rx for glucose meter and kit can only be used for meter  Pharmacy states the meter and kit must be sent as different rx in order for insurance to cover cost

## 2020-12-04 NOTE — Telephone Encounter (Signed)
New meter has been sent in

## 2020-12-04 NOTE — Telephone Encounter (Signed)
Pt. Has called again and is stating that the pharmacy has not heard anything and have not been sent a prescription for glucose kit. Pt. States that the pharmacist states the diagnostic code is incorrect. Would like prescription sent over ASAP.   Please advise.     [place], [place] Phone:  [phone]  Fax:  [phone]      Callback #- [phone]

## 2020-12-04 NOTE — Telephone Encounter (Signed)
Okay to send in any meter then

## 2020-12-04 NOTE — Telephone Encounter (Signed)
New meter has been sent to the patient's pharmacy.

## 2020-12-05 ENCOUNTER — Other Ambulatory Visit: Payer: Self-pay

## 2020-12-05 ENCOUNTER — Ambulatory Visit (INDEPENDENT_AMBULATORY_CARE_PROVIDER_SITE_OTHER): Payer: Medicare Other | Admitting: Internal Medicine

## 2020-12-05 ENCOUNTER — Encounter: Payer: Self-pay | Admitting: Internal Medicine

## 2020-12-05 VITALS — BP 126/78 | HR 70 | Resp 18 | Ht 72.0 in | Wt 245.6 lb

## 2020-12-05 DIAGNOSIS — E039 Hypothyroidism, unspecified: Secondary | ICD-10-CM | POA: Diagnosis not present

## 2020-12-05 DIAGNOSIS — K760 Fatty (change of) liver, not elsewhere classified: Secondary | ICD-10-CM | POA: Diagnosis not present

## 2020-12-05 DIAGNOSIS — F1010 Alcohol abuse, uncomplicated: Secondary | ICD-10-CM | POA: Diagnosis not present

## 2020-12-05 DIAGNOSIS — E1169 Type 2 diabetes mellitus with other specified complication: Secondary | ICD-10-CM | POA: Diagnosis not present

## 2020-12-05 DIAGNOSIS — E785 Hyperlipidemia, unspecified: Secondary | ICD-10-CM

## 2020-12-05 LAB — HEMOGLOBIN A1C: Hgb A1c MFr Bld: 7.2 % — ABNORMAL HIGH (ref 4.6–6.5)

## 2020-12-05 LAB — COMPREHENSIVE METABOLIC PANEL
ALT: 46 U/L (ref 0–53)
AST: 43 U/L — ABNORMAL HIGH (ref 0–37)
Albumin: 4.7 g/dL (ref 3.5–5.2)
Alkaline Phosphatase: 59 U/L (ref 39–117)
BUN: 16 mg/dL (ref 6–23)
CO2: 29 mEq/L (ref 19–32)
Calcium: 9.8 mg/dL (ref 8.4–10.5)
Chloride: 103 mEq/L (ref 96–112)
Creatinine, Ser: 1.1 mg/dL (ref 0.40–1.50)
GFR: 73.92 mL/min (ref >60.00)
Glucose, Bld: 135 mg/dL — ABNORMAL HIGH (ref 70–99)
Potassium: 4.2 mEq/L (ref 3.5–5.1)
Sodium: 140 mEq/L (ref 135–145)
Total Bilirubin: 0.6 mg/dL (ref 0.2–1.2)
Total Protein: 7.7 g/dL (ref 6.0–8.3)

## 2020-12-05 LAB — TSH: TSH: 2.86 u[IU]/mL (ref 0.35–5.50)

## 2020-12-05 LAB — T4, FREE: Free T4: 1.25 ng/dL (ref 0.60–1.60)

## 2020-12-05 NOTE — Assessment & Plan Note (Signed)
Still abstaining completely from alcohol and has taken himself off naltrexone and doing well. Checking CMP and discussed new fatty liver disease finding.

## 2020-12-05 NOTE — Assessment & Plan Note (Signed)
Taking simvastatin 40 mg daily and will continue.

## 2020-12-05 NOTE — Assessment & Plan Note (Signed)
Checking HgA1c today and adjust as needed. He will start checking morning sugar levels and counseled on goals. Foot exam done. Counseled about necessary care for his diabetes. Advised continued cessation from alcohol.

## 2020-12-05 NOTE — Patient Instructions (Signed)
For the sugars in the morning this should be average <150. IF this is consistently >200 let us know.   We are checking the labs today.

## 2020-12-05 NOTE — Progress Notes (Signed)
   Subjective:   Patient ID: Mark Booth, male    DOB: [birth year], 60 y.o.   MRN: [id number]  HPI The patient is a 60 YO man coming in for follow up medical conditions.  Review of Systems  Constitutional:  Positive for fatigue.  HENT: Negative.    Eyes: Negative.   Respiratory:  Negative for cough, chest tightness and shortness of breath.   Cardiovascular:  Negative for chest pain, palpitations and leg swelling.  Gastrointestinal:  Positive for nausea. Negative for abdominal distention, abdominal pain, constipation, diarrhea and vomiting.  Musculoskeletal: Negative.   Skin: Negative.   Neurological: Negative.   Psychiatric/Behavioral: Negative.     Objective:  Physical Exam Constitutional:      Appearance: He is well-developed. He is obese.  HENT:     Head: Normocephalic and atraumatic.  Cardiovascular:     Rate and Rhythm: Normal rate and regular rhythm.  Pulmonary:     Effort: Pulmonary effort is normal. No respiratory distress.     Breath sounds: Normal breath sounds. No wheezing or rales.  Abdominal:     General: Bowel sounds are normal. There is no distension.     Palpations: Abdomen is soft.     Tenderness: There is no abdominal tenderness. There is no rebound.  Musculoskeletal:     Cervical back: Normal range of motion.  Skin:    General: Skin is warm and dry.  Neurological:     Mental Status: He is alert and oriented to person, place, and time.     Coordination: Coordination normal.    Vitals:   12/05/20 1316  BP: 126/78  Pulse: 70  Resp: 18  SpO2: 98%  Weight: 245 lb 9.6 oz (111.4 kg)  Height: 6' (1.829 m)    This visit occurred during the SARS-CoV-2 public health emergency.  Safety protocols were in place, including screening questions prior to the visit, additional usage of staff PPE, and extensive cleaning of exam room while observing appropriate contact time as indicated for disinfecting solutions.   Assessment & Plan:

## 2020-12-05 NOTE — Assessment & Plan Note (Signed)
Still with persistent fatigue so checking TSH and free T4 today. Adjust synthroid 150 mcg daily as needed.

## 2020-12-05 NOTE — Telephone Encounter (Signed)
Pharmacy calling to check status of blood glucose test strips  Pharmacy requesting a separate rx for Lancets w/ diagnostic code and directions

## 2020-12-05 NOTE — Assessment & Plan Note (Signed)
Does have prior alcohol abuse and obesity so may be some component of either. He is currently abstaining from alcohol and intends to make lifelong which is strongly encouraged. He may or may not be at risk for progression to cirrhosis and will need long term intermittent monitoring of lfts. Checking those today with CMP.

## 2020-12-06 ENCOUNTER — Telehealth: Payer: Self-pay | Admitting: Internal Medicine

## 2020-12-06 MED ORDER — CONTOUR NEXT ONE DEVI: 1.0000 | Freq: Every day | 0 refills | Status: DC

## 2020-12-06 MED ORDER — TRUE FOCUS BLOOD GLUCOSE STRIP VI STRP: 1.0000 | ORAL_STRIP | Freq: Every day | 12 refills | Status: DC

## 2020-12-06 MED ORDER — TRUE FOCUS BLOOD GLUCOSE METER DEVI: 1.0000 | Freq: Every day | 0 refills | Status: DC

## 2020-12-06 MED ORDER — CONTOUR NEXT TEST VI STRP: ORAL_STRIP | 12 refills | Status: DC

## 2020-12-06 NOTE — Telephone Encounter (Signed)
New script has been sent to the pharmacy.

## 2020-12-06 NOTE — Addendum Note (Signed)
Addended by: Thomes Cake on: 12/06/2020 11:35 AM   Modules accepted: Orders

## 2020-12-06 NOTE — Telephone Encounter (Signed)
Requested device has been sent to the pharmacy.

## 2020-12-06 NOTE — Telephone Encounter (Signed)
Pharmacy calling to request a new rx for contour next blood glucose monitor  Pharmacy states insurance will not cover cost on previous rx sent in for monitor

## 2020-12-07 NOTE — Telephone Encounter (Signed)
Pharmacist called regarding the last order for the device.  States due to the patient's insurance the supplies has to be order separately. Requesting new order for Contour Next test strips and separate order for Contour micro-lancets. Both orders need to include specific instructions stating the frequency as well as the appropriate diagnostic code. She mentioned the diagnostic code E.11.9 would work for the orders.   Please advise.

## 2020-12-12 MED ORDER — CONTOUR NEXT TEST VI STRP
ORAL_STRIP | 12 refills | Status: DC
Start: 2020-12-12 — End: 2020-12-20

## 2020-12-12 MED ORDER — CONTOUR NEXT ONE DEVI: 1.0000 | Freq: Every day | 0 refills | Status: DC

## 2020-12-12 NOTE — Telephone Encounter (Signed)
Pharmacy calling back in to check status of request  Still have not received new rx for Contour test strips or lancets (see prev message below)

## 2020-12-12 NOTE — Telephone Encounter (Signed)
New script has been sent to the pharmacy.

## 2020-12-12 NOTE — Addendum Note (Signed)
Addended by: Thomes Cake on: 12/12/2020 03:25 PM   Modules accepted: Orders

## 2020-12-19 ENCOUNTER — Encounter: Payer: Self-pay | Admitting: Internal Medicine

## 2020-12-19 NOTE — Telephone Encounter (Signed)
Mark Booth calling in  Says they are needing more specific instructions for the test strips rx  Callback [phone]

## 2020-12-20 ENCOUNTER — Other Ambulatory Visit: Payer: Self-pay

## 2020-12-20 MED ORDER — CONTOUR NEXT ONE DEVI: 1.0000 | Freq: Every day | 0 refills | Status: DC

## 2020-12-20 MED ORDER — CONTOUR NEXT TEST VI STRP
1.0000 | ORAL_STRIP | Freq: Every day | 12 refills | Status: DC
Start: 2020-12-20 — End: 2023-05-12

## 2020-12-21 ENCOUNTER — Other Ambulatory Visit: Payer: Self-pay

## 2020-12-21 MED ORDER — MICROLET LANCETS MISC: 1.0000 | Freq: Every day | 2 refills | Status: DC

## 2021-01-08 ENCOUNTER — Other Ambulatory Visit: Payer: Self-pay | Admitting: Internal Medicine

## 2021-01-08 DIAGNOSIS — I10 Essential (primary) hypertension: Secondary | ICD-10-CM

## 2021-01-22 ENCOUNTER — Other Ambulatory Visit: Payer: Self-pay | Admitting: Internal Medicine

## 2021-01-22 DIAGNOSIS — E039 Hypothyroidism, unspecified: Secondary | ICD-10-CM

## 2021-01-23 ENCOUNTER — Telehealth: Payer: Self-pay | Admitting: Internal Medicine

## 2021-01-23 NOTE — Telephone Encounter (Signed)
Ok to switch the brand to Accord?

## 2021-01-23 NOTE — Telephone Encounter (Signed)
[facility] called to see if they can get approval to fill the prescription levothyroxine since they have a new manufacture  Accord.  [phone]

## 2021-01-23 NOTE — Telephone Encounter (Signed)
Ok for brand switch

## 2021-01-24 NOTE — Telephone Encounter (Signed)
Spoke with pharmacy to give the verbal ok to switch to Accord for his levothyroxine.

## 2021-01-25 ENCOUNTER — Other Ambulatory Visit: Payer: Self-pay | Admitting: Internal Medicine

## 2021-01-25 DIAGNOSIS — E039 Hypothyroidism, unspecified: Secondary | ICD-10-CM

## 2021-02-18 ENCOUNTER — Other Ambulatory Visit: Payer: Self-pay | Admitting: Internal Medicine

## 2021-02-21 ENCOUNTER — Ambulatory Visit: Payer: Medicare Other | Admitting: Internal Medicine

## 2021-03-02 ENCOUNTER — Encounter: Payer: Self-pay | Admitting: Internal Medicine

## 2021-03-02 ENCOUNTER — Ambulatory Visit (INDEPENDENT_AMBULATORY_CARE_PROVIDER_SITE_OTHER): Payer: Medicare Other | Admitting: Internal Medicine

## 2021-03-02 ENCOUNTER — Other Ambulatory Visit: Payer: Self-pay

## 2021-03-02 VITALS — BP 124/70 | HR 71 | Resp 18 | Ht 72.0 in | Wt 246.4 lb

## 2021-03-02 DIAGNOSIS — E1169 Type 2 diabetes mellitus with other specified complication: Secondary | ICD-10-CM | POA: Diagnosis not present

## 2021-03-02 DIAGNOSIS — R3911 Hesitancy of micturition: Secondary | ICD-10-CM | POA: Diagnosis not present

## 2021-03-02 DIAGNOSIS — R5383 Other fatigue: Secondary | ICD-10-CM | POA: Diagnosis not present

## 2021-03-02 DIAGNOSIS — N401 Enlarged prostate with lower urinary tract symptoms: Secondary | ICD-10-CM

## 2021-03-02 LAB — POCT GLYCOSYLATED HEMOGLOBIN (HGB A1C): Hemoglobin A1C: 8.1 % — AB (ref 4.0–5.6)

## 2021-03-02 MED ORDER — RYBELSUS 3 MG PO TABS: 3.0000 mg | ORAL_TABLET | Freq: Every day | ORAL | 0 refills | Status: DC

## 2021-03-02 MED ORDER — RYBELSUS 7 MG PO TABS: 7.0000 mg | ORAL_TABLET | Freq: Every day | ORAL | 3 refills | Status: DC

## 2021-03-02 MED ORDER — TAMSULOSIN HCL 0.4 MG PO CAPS: 0.4000 mg | ORAL_CAPSULE | Freq: Every day | ORAL | 3 refills | Status: DC

## 2021-03-02 NOTE — Progress Notes (Signed)
° °  Subjective:   Patient ID: Mark Booth, male    DOB: 01-11-1961, 61 y.o.   MRN: [id number]  Diabetes Associated symptoms include fatigue. Pertinent negatives for diabetes include no chest pain.  The patient is a 61 YO man coming in for follow up diabetes and with other concerns.   Review of Systems  Constitutional:  Positive for fatigue.  HENT: Negative.    Eyes: Negative.   Respiratory:  Negative for cough, chest tightness and shortness of breath.   Cardiovascular:  Negative for chest pain, palpitations and leg swelling.  Gastrointestinal:  Negative for abdominal distention, abdominal pain, constipation, diarrhea, nausea and vomiting.  Genitourinary:  Positive for enuresis.  Musculoskeletal: Negative.   Skin: Negative.   Neurological: Negative.   Psychiatric/Behavioral:  Positive for sleep disturbance.    Objective:  Physical Exam Constitutional:      Appearance: He is well-developed.  HENT:     Head: Normocephalic and atraumatic.  Cardiovascular:     Rate and Rhythm: Normal rate and regular rhythm.  Pulmonary:     Effort: Pulmonary effort is normal. No respiratory distress.     Breath sounds: Normal breath sounds. No wheezing or rales.  Abdominal:     General: Bowel sounds are normal. There is no distension.     Palpations: Abdomen is soft.     Tenderness: There is no abdominal tenderness. There is no rebound.  Musculoskeletal:     Cervical back: Normal range of motion.  Skin:    General: Skin is warm and dry.  Neurological:     Mental Status: He is alert and oriented to person, place, and time.     Coordination: Coordination normal.    Vitals:   03/02/21 1056  BP: 124/70  Pulse: 71  Resp: 18  SpO2: 96%  Weight: 246 lb 6.4 oz (111.8 kg)  Height: 6' (1.829 m)    This visit occurred during the SARS-CoV-2 public health emergency.  Safety protocols were in place, including screening questions prior to the visit, additional usage of staff PPE, and extensive  cleaning of exam room while observing appropriate contact time as indicated for disinfecting solutions.   Assessment & Plan:

## 2021-03-02 NOTE — Assessment & Plan Note (Signed)
POC HgA1c done and worse today at 8.1. Adding rybelsus 3 mg daily for 1 month then increase to 7 mg daily until return visit in 3 months. He does desire weight loss as well.

## 2021-03-02 NOTE — Assessment & Plan Note (Signed)
Adding flomax to see if night time awakenings for urination are contributing. If no improvement needs sleep study as none in the past.

## 2021-03-02 NOTE — Assessment & Plan Note (Signed)
Rx flomax 0.4 mg qhs to take as he is having about 5 night time awakenings for urination.

## 2021-03-02 NOTE — Patient Instructions (Signed)
We have sent in rybelsus 3 mg daily to take 1 pill daily for the first month. Then when you refill it will be 7 mg daily. Come back in 3 months.  We have sent in flomax 1 pill daily at night time to help with urination.

## 2021-03-03 ENCOUNTER — Encounter: Payer: Self-pay | Admitting: Internal Medicine

## 2021-03-13 ENCOUNTER — Other Ambulatory Visit: Payer: Self-pay | Admitting: Internal Medicine

## 2021-04-16 ENCOUNTER — Other Ambulatory Visit: Payer: Self-pay | Admitting: Internal Medicine

## 2021-04-16 DIAGNOSIS — E039 Hypothyroidism, unspecified: Secondary | ICD-10-CM

## 2021-04-16 DIAGNOSIS — I10 Essential (primary) hypertension: Secondary | ICD-10-CM

## 2021-05-04 ENCOUNTER — Ambulatory Visit: Payer: Medicare Other

## 2021-05-07 ENCOUNTER — Ambulatory Visit (INDEPENDENT_AMBULATORY_CARE_PROVIDER_SITE_OTHER): Payer: Medicare Other

## 2021-05-07 DIAGNOSIS — Z0000 Encounter for general adult medical examination without abnormal findings: Secondary | ICD-10-CM | POA: Diagnosis not present

## 2021-05-07 NOTE — Progress Notes (Signed)
? ?Subjective:  ? Mark Booth is a 61 y.o. male who presents for an Subsequent Medicare Annual Wellness Visit. ? ?Review of Systems    ? ?Cardiac Risk Factors include: advanced age (>25mn, >>66women);diabetes mellitus;dyslipidemia;male gender;hypertension ? ?   ?Objective:  ?  ?Today's Vitals  ? ?There is no height or weight on file to calculate BMI. ? ? ?  05/07/2021  ?  2:41 PM 04/25/2020  ?  1:09 PM 01/19/2014  ?  3:06 PM  ?Advanced Directives  ?Does Patient Have a Medical Advance Directive? No No No  ?Would patient like information on creating a medical advance directive? No - Patient declined No - Patient declined No - patient declined information  ? ? ?Current Medications (verified) ?Outpatient Encounter Medications as of 05/07/2021  ?Medication Sig  ? albuterol (VENTOLIN HFA) 108 (90 Base) MCG/ACT inhaler INHALE 2 PUFFS BY MOUTH EVERY 6 HOURS AS NEEDED  ? ARIPiprazole (ABILIFY) 10 MG tablet Take 10 mg by mouth daily.  ? Blood Glucose Monitoring Suppl (CONTOUR NEXT ONE) DEVI 1 Device by Does not apply route daily.  ? BYSTOLIC 10 MG tablet Take 1 tablet by mouth once daily  ? citalopram (CELEXA) 40 MG tablet Take 40 mg by mouth at bedtime.  ? fenofibrate 160 MG tablet Take 1 tablet by mouth once daily  ? fluticasone-salmeterol (ADVAIR) 100-50 MCG/ACT AEPB Inhale 1 puff into the lungs 2 (two) times daily.  ? Fluticasone-Salmeterol (ADVAIR) 100-50 MCG/DOSE AEPB Inhale 1 puff into the lungs 2 (two) times daily.  ? glucose blood (CONTOUR NEXT TEST) test strip 1 each by Other route daily. Use as instructed  ? lamoTRIgine (LAMICTAL) 100 MG tablet Take 100 mg by mouth daily.  ? levothyroxine (SYNTHROID) 150 MCG tablet TAKE 1 TABLET BY MOUTH ONCE DAILY BEFORE BREAKFAST  ? lisinopril-hydrochlorothiazide (ZESTORETIC) 10-12.5 MG tablet Take 1 tablet by mouth once daily  ? Microlet Lancets MISC 1 Stick by Does not apply route daily.  ? Semaglutide (RYBELSUS) 7 MG TABS Take 7 mg by mouth daily.  ? simvastatin (ZOCOR) 40 MG  tablet TAKE 1 TABLET BY MOUTH ONCE DAILY AT  6:00 PM  ? tamsulosin (FLOMAX) 0.4 MG CAPS capsule Take 1 capsule (0.4 mg total) by mouth daily.  ? oxybutynin (DITROPAN) 5 MG tablet Take 5 mg by mouth 2 (two) times daily.  ? Semaglutide (RYBELSUS) 3 MG TABS Take 3 mg by mouth daily.  ? ?No facility-administered encounter medications on file as of 05/07/2021.  ? ? ?Allergies (verified) ?Patient has no known allergies.  ? ?History: ?Past Medical History:  ?Diagnosis Date  ? Allergic rhinitis   ? Allergy   ? AS A CHILD  ? Asthma   ? Bipolar disorder ([place]   ? Cataract   ? BILATERAL-REMOVED  ? Depression   ? Hyperlipidemia   ? Hypertension   ? Hypothyroidism   ? Osteoarthritis   ? Overweight(278.02)   ? Unspecified vitamin D deficiency   ? ?Past Surgical History:  ?Procedure Laterality Date  ? APPENDECTOMY    ? CATARACT EXTRACTION  12-2012  ? BILATERAL  ? COLONOSCOPY    ? POLYPECTOMY    ? SHOULDER SURGERY  2013  ? ?Family History  ?Problem Relation Age of Onset  ? Osteoporosis Mother   ? Depression Mother   ? Diabetes Mother   ? Hypothyroidism Mother   ? Pancreatic cancer Mother   ? Prostate cancer Father   ? Colon cancer Paternal Grandfather   ? Rectal cancer Neg Hx   ?  Stomach cancer Neg Hx   ? Esophageal cancer Neg Hx   ? ?Social History  ? ?Socioeconomic History  ? Marital status: Single  ?  Spouse name: Not on file  ? Number of children: Not on file  ? Years of education: Not on file  ? Highest education level: Not on file  ?Occupational History  ? Occupation: Unemployed  ?Tobacco Use  ? Smoking status: Never  ? Smokeless tobacco: Never  ?Vaping Use  ? Vaping Use: Never used  ?Substance and Sexual Activity  ? Alcohol use: Yes  ?  Alcohol/week: 42.0 standard drinks  ?  Types: 42 Cans of beer per week  ? Drug use: No  ? Sexual activity: Not Currently  ?Other Topics Concern  ? Not on file  ?Social History Narrative  ? Not on file  ? ?Social Determinants of Health  ? ?Financial Resource Strain: Low Risk   ? Difficulty of  Paying Living Expenses: Not hard at all  ?Food Insecurity: No Food Insecurity  ? Worried About Charity fundraiser in the Last Year: Never true  ? Ran Out of Food in the Last Year: Never true  ?Transportation Needs: No Transportation Needs  ? Lack of Transportation (Medical): No  ? Lack of Transportation (Non-Medical): No  ?Physical Activity: Inactive  ? Days of Exercise per Week: 0 days  ? Minutes of Exercise per Session: 0 min  ?Stress: No Stress Concern Present  ? Feeling of Stress : Not at all  ?Social Connections: Socially Isolated  ? Frequency of Communication with Friends and Family: Twice a week  ? Frequency of Social Gatherings with Friends and Family: Twice a week  ? Attends Religious Services: Never  ? Active Member of Clubs or Organizations: No  ? Attends Archivist Meetings: Never  ? Marital Status: Never married  ? ? ?Tobacco Counseling ?Counseling given: Not Answered ? ? ?Clinical Intake: ? ?Pre-visit preparation completed: Yes ? ?Pain : No/denies pain ? ?  ? ?Nutritional Risks: None ?Diabetes: Yes ?CBG done?: No ?Did pt. bring in CBG monitor from home?: No ? ?How often do you need to have someone help you when you read instructions, pamphlets, or other written materials from your doctor or pharmacy?: 1 - Never ?What is the last grade level you completed in school?: PHD ? ?Diabetic?yes ?Nutrition Risk Assessment: ? ?Has the patient had any N/V/D within the last 2 months?  No  ?Does the patient have any non-healing wounds?  No  ?Has the patient had any unintentional weight loss or weight gain?  No  ? ?Diabetes: ? ?Is the patient diabetic?  Yes  ?If diabetic, was a CBG obtained today?  No  ?Did the patient bring in their glucometer from home?  No  ?How often do you monitor your CBG's? Daily .  ? ?Financial Strains and Diabetes Management: ? ?Are you having any financial strains with the device, your supplies or your medication? No .  ?Does the patient want to be seen by Chronic Care Management  for management of their diabetes?  No  ?Would the patient like to be referred to a Nutritionist or for Diabetic Management?  No  ? ?Diabetic Exams: ? ?Diabetic Eye Exam: Completed 12/2020 ?Diabetic Foot Exam: Overdue, Pt has been advised about the importance in completing this exam. Pt is scheduled for diabetic foot exam on next office visit .  ? ?Interpreter Needed?: No ? ?Information entered by :: [email or web address] ? ? ?Activities of Daily Living ? ?  05/07/2021  ?  2:42 PM  ?In your present state of health, do you have any difficulty performing the following activities:  ?Hearing? 0  ?Vision? 0  ?Difficulty concentrating or making decisions? 0  ?Walking or climbing stairs? 0  ?Dressing or bathing? 0  ?Doing errands, shopping? 0  ?Preparing Food and eating ? N  ?Using the Toilet? N  ?In the past six months, have you accidently leaked urine? N  ?Do you have problems with loss of bowel control? N  ?Managing your Medications? N  ?Managing your Finances? N  ?Housekeeping or managing your Housekeeping? N  ? ? ?Patient Care Team: ?Hoyt Koch, MD as PCP - General (Internal Medicine) ?Foltanski, Cleaster Corin, [facility] as Pharmacist (Pharmacist) ?Hortencia Pilar, MD as Consulting Physician (Ophthalmology) ? ?Indicate any recent Medical Services you may have received from other than [facility] providers in the past year (date may be approximate). ? ?   ?Assessment:  ? This is a routine wellness examination for [place]. ? ?Hearing/Vision screen ?Vision Screening - Comments:: Annual eye exams wears glasses  ? ?Dietary issues and exercise activities discussed: ?Current Exercise Habits: The patient does not participate in regular exercise at present, Exercise limited by: orthopedic condition(s) ? ? Goals Addressed   ?None ?  ? ?Depression Screen ? ?  05/07/2021  ?  2:42 PM 05/07/2021  ?  2:40 PM 04/25/2020  ?  1:11 PM 10/05/2018  ?  1:13 PM  ?PHQ 2/9 Scores  ?PHQ - 2 Score 0 0 4 1  ?  ?Fall Risk ? ?  05/07/2021  ?  2:42 PM 04/25/2020  ?   1:10 PM 06/17/2019  ?  1:11 PM  ?Fall Risk   ?Falls in the past year? 0 1 0  ?Number falls in past yr: 0 0 0  ?Injury with Fall? 0 0 0  ?Risk for fall due to :  No Fall Risks   ?Follow up Falls evaluati

## 2021-05-07 NOTE — Patient Instructions (Signed)
[name] , ?Thank you for taking time to come for your Medicare Wellness Visit. I appreciate your ongoing commitment to your health goals. Please review the following plan we discussed and let me know if I can assist you in the future.  ? ?Screening recommendations/referrals: ?Colonoscopy: 11/18/2018 ?Recommended yearly ophthalmology/optometry visit for glaucoma screening and checkup ?Recommended yearly dental visit for hygiene and checkup ? ?Vaccinations: ?Influenza vaccine: declined  ?Pneumococcal vaccine: due  ?Tdap vaccine: due  ?Shingles vaccine: will consider    ? ?Advanced directives: none  ? ?Conditions/risks identified: none  ? ?Next appointment: none  ? ?Preventive Care 20 Years and Older, Male ?Preventive care refers to lifestyle choices and visits with your health care provider that can promote health and wellness. ?What does preventive care include? ?A yearly physical exam. This is also called an annual well check. ?Dental exams once or twice a year. ?Routine eye exams. Ask your health care provider how often you should have your eyes checked. ?Personal lifestyle choices, including: ?Daily care of your teeth and gums. ?Regular physical activity. ?Eating a healthy diet. ?Avoiding tobacco and drug use. ?Limiting alcohol use. ?Practicing safe sex. ?Taking low doses of aspirin every day. ?Taking vitamin and mineral supplements as recommended by your health care provider. ?What happens during an annual well check? ?The services and screenings done by your health care provider during your annual well check will depend on your age, overall health, lifestyle risk factors, and family history of disease. ?Counseling  ?Your health care provider may ask you questions about your: ?Alcohol use. ?Tobacco use. ?Drug use. ?Emotional well-being. ?Home and relationship well-being. ?Sexual activity. ?Eating habits. ?History of falls. ?Memory and ability to understand (cognition). ?Work and work Statistician. ?Screening   ?You may have the following tests or measurements: ?Height, weight, and BMI. ?Blood pressure. ?Lipid and cholesterol levels. These may be checked every 5 years, or more frequently if you are over 27 years old. ?Skin check. ?Lung cancer screening. You may have this screening every year starting at age 79 if you have a 30-pack-year history of smoking and currently smoke or have quit within the past 15 years. ?Fecal occult blood test (FOBT) of the stool. You may have this test every year starting at age 84. ?Flexible sigmoidoscopy or colonoscopy. You may have a sigmoidoscopy every 5 years or a colonoscopy every 10 years starting at age 61. ?Prostate cancer screening. Recommendations will vary depending on your family history and other risks. ?Hepatitis C blood test. ?Hepatitis B blood test. ?Sexually transmitted disease (STD) testing. ?Diabetes screening. This is done by checking your blood sugar (glucose) after you have not eaten for a while (fasting). You may have this done every 1-3 years. ?Abdominal aortic aneurysm (AAA) screening. You may need this if you are a current or former smoker. ?Osteoporosis. You may be screened starting at age 63 if you are at high risk. ?Talk with your health care provider about your test results, treatment options, and if necessary, the need for more tests. ?Vaccines  ?Your health care provider may recommend certain vaccines, such as: ?Influenza vaccine. This is recommended every year. ?Tetanus, diphtheria, and acellular pertussis (Tdap, Td) vaccine. You may need a Td booster every 10 years. ?Zoster vaccine. You may need this after age 7. ?Pneumococcal 13-valent conjugate (PCV13) vaccine. One dose is recommended after age 60. ?Pneumococcal polysaccharide (PPSV23) vaccine. One dose is recommended after age 36. ?Talk to your health care provider about which screenings and vaccines you need and how often  you need them. ?This information is not intended to replace advice given to you by  your health care provider. Make sure you discuss any questions you have with your health care provider. ?Document Released: 02/24/2015 Document Revised: 10/18/2015 Document Reviewed: 11/29/2014 ?Elsevier Interactive Patient Education ? 2017 [place]. ? ?Fall Prevention in the Home ?Falls can cause injuries. They can happen to people of all ages. There are many things you can do to make your home safe and to help prevent falls. ?What can I do on the outside of my home? ?Regularly fix the edges of walkways and driveways and fix any cracks. ?Remove anything that might make you trip as you walk through a door, such as a raised step or threshold. ?Trim any bushes or trees on the path to your home. ?Use bright outdoor lighting. ?Clear any walking paths of anything that might make someone trip, such as rocks or tools. ?Regularly check to see if handrails are loose or broken. Make sure that both sides of any steps have handrails. ?Any raised decks and porches should have guardrails on the edges. ?Have any leaves, snow, or ice cleared regularly. ?Use sand or salt on walking paths during winter. ?Clean up any spills in your garage right away. This includes oil or grease spills. ?What can I do in the bathroom? ?Use night lights. ?Install grab bars by the toilet and in the tub and shower. Do not use towel bars as grab bars. ?Use non-skid mats or decals in the tub or shower. ?If you need to sit down in the shower, use a plastic, non-slip stool. ?Keep the floor dry. Clean up any water that spills on the floor as soon as it happens. ?Remove soap buildup in the tub or shower regularly. ?Attach bath mats securely with double-sided non-slip rug tape. ?Do not have throw rugs and other things on the floor that can make you trip. ?What can I do in the bedroom? ?Use night lights. ?Make sure that you have a light by your bed that is easy to reach. ?Do not use any sheets or blankets that are too big for your bed. They should not hang  down onto the floor. ?Have a firm chair that has side arms. You can use this for support while you get dressed. ?Do not have throw rugs and other things on the floor that can make you trip. ?What can I do in the kitchen? ?Clean up any spills right away. ?Avoid walking on wet floors. ?Keep items that you use a lot in easy-to-reach places. ?If you need to reach something above you, use a strong step stool that has a grab bar. ?Keep electrical cords out of the way. ?Do not use floor polish or wax that makes floors slippery. If you must use wax, use non-skid floor wax. ?Do not have throw rugs and other things on the floor that can make you trip. ?What can I do with my stairs? ?Do not leave any items on the stairs. ?Make sure that there are handrails on both sides of the stairs and use them. Fix handrails that are broken or loose. Make sure that handrails are as long as the stairways. ?Check any carpeting to make sure that it is firmly attached to the stairs. Fix any carpet that is loose or worn. ?Avoid having throw rugs at the top or bottom of the stairs. If you do have throw rugs, attach them to the floor with carpet tape. ?Make sure that you have a light  switch at the top of the stairs and the bottom of the stairs. If you do not have them, ask someone to add them for you. ?What else can I do to help prevent falls? ?Wear shoes that: ?Do not have high heels. ?Have rubber bottoms. ?Are comfortable and fit you well. ?Are closed at the toe. Do not wear sandals. ?If you use a stepladder: ?Make sure that it is fully opened. Do not climb a closed stepladder. ?Make sure that both sides of the stepladder are locked into place. ?Ask someone to hold it for you, if possible. ?Clearly mark and make sure that you can see: ?Any grab bars or handrails. ?First and last steps. ?Where the edge of each step is. ?Use tools that help you move around (mobility aids) if they are needed. These  include: ?Canes. ?Walkers. ?Scooters. ?Crutches. ?Turn on the lights when you go into a dark area. Replace any light bulbs as soon as they burn out. ?Set up your furniture so you have a clear path. Avoid moving your furniture around. ?If any of your f

## 2021-05-11 ENCOUNTER — Other Ambulatory Visit: Payer: Self-pay | Admitting: Internal Medicine

## 2021-05-16 ENCOUNTER — Other Ambulatory Visit: Payer: Self-pay | Admitting: Internal Medicine

## 2021-05-16 DIAGNOSIS — E039 Hypothyroidism, unspecified: Secondary | ICD-10-CM

## 2021-05-20 ENCOUNTER — Other Ambulatory Visit: Payer: Self-pay | Admitting: Internal Medicine

## 2021-05-20 DIAGNOSIS — I10 Essential (primary) hypertension: Secondary | ICD-10-CM

## 2021-05-23 ENCOUNTER — Ambulatory Visit: Payer: Medicare Other | Admitting: Internal Medicine

## 2021-05-30 ENCOUNTER — Other Ambulatory Visit: Payer: Self-pay | Admitting: Internal Medicine

## 2021-05-30 ENCOUNTER — Encounter: Payer: Self-pay | Admitting: Internal Medicine

## 2021-05-30 DIAGNOSIS — I10 Essential (primary) hypertension: Secondary | ICD-10-CM

## 2021-05-31 ENCOUNTER — Other Ambulatory Visit: Payer: Self-pay | Admitting: Internal Medicine

## 2021-05-31 MED ORDER — NEBIVOLOL HCL 10 MG PO TABS: 10.0000 mg | ORAL_TABLET | Freq: Every day | ORAL | 3 refills | Status: DC

## 2021-06-01 ENCOUNTER — Encounter: Payer: Self-pay | Admitting: Internal Medicine

## 2021-06-01 ENCOUNTER — Ambulatory Visit (INDEPENDENT_AMBULATORY_CARE_PROVIDER_SITE_OTHER): Payer: Medicare Other | Admitting: Internal Medicine

## 2021-06-01 VITALS — BP 130/90 | HR 93 | Resp 18 | Ht 72.0 in | Wt 240.2 lb

## 2021-06-01 DIAGNOSIS — F1010 Alcohol abuse, uncomplicated: Secondary | ICD-10-CM

## 2021-06-01 DIAGNOSIS — E1169 Type 2 diabetes mellitus with other specified complication: Secondary | ICD-10-CM

## 2021-06-01 DIAGNOSIS — K760 Fatty (change of) liver, not elsewhere classified: Secondary | ICD-10-CM | POA: Diagnosis not present

## 2021-06-01 LAB — POCT GLYCOSYLATED HEMOGLOBIN (HGB A1C): Hemoglobin A1C: 6.7 % — AB (ref 4.0–5.6)

## 2021-06-01 LAB — CBC
HCT: 40.7 % (ref 39.0–52.0)
Hemoglobin: 13.8 g/dL (ref 13.0–17.0)
MCHC: 34 g/dL (ref 30.0–36.0)
MCV: 90.8 fl (ref 78.0–100.0)
Platelets: 252 10*3/uL (ref 150.0–400.0)
RBC: 4.48 Mil/uL (ref 4.22–5.81)
RDW: 13.5 % (ref 11.5–15.5)
WBC: 7.6 10*3/uL (ref 4.0–10.5)

## 2021-06-01 LAB — COMPREHENSIVE METABOLIC PANEL
ALT: 24 U/L (ref 0–53)
AST: 34 U/L (ref 0–37)
Albumin: 4.5 g/dL (ref 3.5–5.2)
Alkaline Phosphatase: 51 U/L (ref 39–117)
BUN: 13 mg/dL (ref 6–23)
CO2: 25 mEq/L (ref 19–32)
Calcium: 9.6 mg/dL (ref 8.4–10.5)
Chloride: 105 mEq/L (ref 96–112)
Creatinine, Ser: 1.19 mg/dL (ref 0.40–1.50)
GFR: 67.03 mL/min (ref >60.00)
Glucose, Bld: 132 mg/dL — ABNORMAL HIGH (ref 70–99)
Potassium: 4.1 mEq/L (ref 3.5–5.1)
Sodium: 139 mEq/L (ref 135–145)
Total Bilirubin: 0.4 mg/dL (ref 0.2–1.2)
Total Protein: 7.5 g/dL (ref 6.0–8.3)

## 2021-06-01 MED ORDER — RYBELSUS 14 MG PO TABS: 14.0000 mg | ORAL_TABLET | Freq: Every day | ORAL | 3 refills | Status: DC

## 2021-06-01 NOTE — Assessment & Plan Note (Signed)
Checking CBC and CMP for abnormalities and seeing GI in upcoming future. He is aware that aware and fatty liver disease carries more risk for cirrhosis in the future and he would be better to avoid alcohol for life.  ?

## 2021-06-01 NOTE — Assessment & Plan Note (Signed)
Has had recurrence unfortunately since last visit and has quit again about 3 days ago. Is going to be starting naltrexone to help avoid alcohol and encouraged lifelong cessation. Recheck LFTs today.  ?

## 2021-06-01 NOTE — Assessment & Plan Note (Signed)
Started rybelsus 3 mg since last visit and then titrated to 7 mg daily. He has done well with this and is down 6 pounds. We will increase to 14 mg daily rybelsus and new rx done today. POC HgA1c done today and is 6.7 which is improved from prior and now at goal of <7.5%. Is on ACE-I and statin. ?

## 2021-06-01 NOTE — Progress Notes (Signed)
? ?  Subjective:  ? ?Patient ID: Mark Booth, male    DOB: February 22, 1960, 61 y.o.   MRN: [id number] ? ?HPI ?The patient is a 61 YO coming in for follow up ? ?Review of Systems  ?Constitutional: Negative.   ?HENT: Negative.    ?Eyes: Negative.   ?Respiratory:  Negative for cough, chest tightness and shortness of breath.   ?Cardiovascular:  Negative for chest pain, palpitations and leg swelling.  ?Gastrointestinal:  Negative for abdominal distention, abdominal pain, constipation, diarrhea, nausea and vomiting.  ?Musculoskeletal: Negative.   ?Skin: Negative.   ?Neurological: Negative.   ?Psychiatric/Behavioral: Negative.    ? ?Objective:  ?Physical Exam ?Constitutional:   ?   Appearance: He is well-developed. He is obese.  ?HENT:  ?   Head: Normocephalic and atraumatic.  ?Cardiovascular:  ?   Rate and Rhythm: Normal rate and regular rhythm.  ?Pulmonary:  ?   Effort: Pulmonary effort is normal. No respiratory distress.  ?   Breath sounds: Normal breath sounds. No wheezing or rales.  ?Abdominal:  ?   General: Bowel sounds are normal. There is no distension.  ?   Palpations: Abdomen is soft.  ?   Tenderness: There is no abdominal tenderness. There is no rebound.  ?Musculoskeletal:  ?   Cervical back: Normal range of motion.  ?Skin: ?   General: Skin is warm and dry.  ?Neurological:  ?   Mental Status: He is alert and oriented to person, place, and time.  ?   Coordination: Coordination normal.  ? ? ?Vitals:  ? 06/01/21 1027  ?BP: 130/90  ?Pulse: 93  ?Resp: 18  ?SpO2: 97%  ?Weight: 240 lb 3.2 oz (109 kg)  ?Height: 6' (1.829 m)  ? ? ?This visit occurred during the SARS-CoV-2 public health emergency.  Safety protocols were in place, including screening questions prior to the visit, additional usage of staff PPE, and extensive cleaning of exam room while observing appropriate contact time as indicated for disinfecting solutions.  ? ?Assessment & Plan:  ? ?

## 2021-06-01 NOTE — Patient Instructions (Addendum)
We will increase the rybelsus to 14 mg daily to help the weight and you can take 2 pills daily until you run out. ? ?We will check the labs today. ?

## 2021-06-07 ENCOUNTER — Other Ambulatory Visit: Payer: Self-pay | Admitting: Internal Medicine

## 2021-06-15 ENCOUNTER — Other Ambulatory Visit: Payer: Self-pay | Admitting: Internal Medicine

## 2021-06-19 ENCOUNTER — Other Ambulatory Visit (INDEPENDENT_AMBULATORY_CARE_PROVIDER_SITE_OTHER): Payer: Medicare Other

## 2021-06-19 ENCOUNTER — Encounter: Payer: Self-pay | Admitting: Internal Medicine

## 2021-06-19 ENCOUNTER — Ambulatory Visit (INDEPENDENT_AMBULATORY_CARE_PROVIDER_SITE_OTHER): Payer: Medicare Other | Admitting: Internal Medicine

## 2021-06-19 VITALS — BP 126/70 | HR 84 | Ht 72.0 in | Wt 235.0 lb

## 2021-06-19 DIAGNOSIS — K760 Fatty (change of) liver, not elsewhere classified: Secondary | ICD-10-CM

## 2021-06-19 DIAGNOSIS — F1090 Alcohol use, unspecified, uncomplicated: Secondary | ICD-10-CM | POA: Diagnosis not present

## 2021-06-19 DIAGNOSIS — R7989 Other specified abnormal findings of blood chemistry: Secondary | ICD-10-CM

## 2021-06-19 LAB — IBC + FERRITIN
Ferritin: 226.2 ng/mL (ref 22.0–322.0)
Iron: 104 ug/dL (ref 42–165)
Saturation Ratios: 20.3 % (ref 20.0–50.0)
TIBC: 512.4 ug/dL — ABNORMAL HIGH (ref 250.0–450.0)
Transferrin: 366 mg/dL — ABNORMAL HIGH (ref 212.0–360.0)

## 2021-06-19 LAB — PROTIME-INR
INR: 1 ratio (ref 0.8–1.0)
Prothrombin Time: 10.9 s (ref 9.6–13.1)

## 2021-06-19 NOTE — Progress Notes (Signed)
HISTORY OF PRESENT ILLNESS: ? ?Mark Booth is a 61 y.o. male, disabled from bipolar disorder, who was sent today by his primary care provider regarding elevated hepatic transaminases and fatty liver on imaging.  I have seen the patient in the past for colonoscopy.  He does have a history of adenomatous colon polyps and underwent colonoscopy in January 2015 as well as October 2020.  He has not been seen since. ? ?His current history is that of elevated hepatic transaminases noted in August 2022.  At that time AST 87, ALT 62, alkaline phosphatase 48, and total bilirubin 0.5.  Normal proteins and globulins.  CBC at that time was normal including platelets and MCV.  Repeat liver tests 1 and 2 months later remain elevated (isolated AST less than 50).  He then underwent a limited right upper quadrant ultrasound November 14, 2020.  He was noted to have diffusely increased liver echogenicity consistent with hepatic steatosis.  Up until that point, the patient tells me that he describes himself as an alcoholic.  He drinks at least a sixpack of beer daily.  May drink as much as 2 sixpacks in a day.  He tells me that after being diagnosed with fatty liver he stopped drinking alcohol for 6 months.  Repeat liver tests June 01, 2021 have normalized though still with AST greater than ALT pattern.  He tells me that he has resumed drinking alcohol.  He is concerned about the effects on his liver long-term.  He is interested in losing weight and has been successful on Rybelsus.  This is helped his diabetes, as well.  His hemoglobin A1c has improved from 8.1-6.7..  He would like to get down to 200 pounds.  He does not exercise regularly.  He denies a family history of liver disease or personal history of abnormal liver test prior to recent notification. ? ?REVIEW OF SYSTEMS: ? ?All non-GI ROS negative unless otherwise stated in the HPI except for allergies, depression ? ?Past Medical History:  ?Diagnosis Date  ? Allergic rhinitis    ? Allergy   ? AS A CHILD  ? Asthma   ? Bipolar disorder ([place])   ? Cataract   ? BILATERAL-REMOVED  ? Depression   ? Hyperlipidemia   ? Hypertension   ? Hypothyroidism   ? Osteoarthritis   ? Overweight(278.02)   ? Unspecified vitamin D deficiency   ? ? ?Past Surgical History:  ?Procedure Laterality Date  ? APPENDECTOMY    ? CATARACT EXTRACTION  12-2012  ? BILATERAL  ? COLONOSCOPY    ? POLYPECTOMY    ? SHOULDER SURGERY  2013  ? ? ?Social History ?Khalin Barbian  reports that he has never smoked. He has never used smokeless tobacco. He reports current alcohol use of about 42.0 standard drinks per week. He reports that he does not use drugs. ? ?family history includes Colon cancer in his paternal grandfather; Depression in his mother; Diabetes in his mother; Hypothyroidism in his mother; Osteoporosis in his mother; Pancreatic cancer in his mother; Prostate cancer in his father. ? ?No Known Allergies ? ?  ? ?PHYSICAL EXAMINATION: ?Vital signs: BP 126/70   Pulse 84   Ht 6' (1.829 m)   Wt 235 lb (106.6 kg)   BMI 31.87 kg/m?   ?Constitutional: Pleasant, generally well-appearing, no acute distress ?Psychiatric: alert and oriented x3, cooperative ?Eyes: extraocular movements intact, anicteric, conjunctiva pink ?Mouth: oral pharynx moist, no lesions ?Neck: supple no lymphadenopathy ?Cardiovascular: heart regular rate and rhythm, no murmur ?  Lungs: clear to auscultation bilaterally ?Abdomen: soft, obese, nontender, nondistended, no obvious ascites, no peritoneal signs, normal bowel sounds, no organomegaly ?Rectal: Omitted ?Extremities: no clubbing or cyanosis.  Trace lower extremity edema bilaterally ?Skin: no lesions on visible extremities ?Neuro: No focal deficits. No asterixis.  ? ? ? ?ASSESSMENT: ? ?1.  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  Improved with sustained abstinence. ?2.  Fatty liver on ultrasound.  Risk factors for this sonographic pattern include chronic alcohol use, obesity,  and diabetes. ?3.  Metabolic syndrome ?4.  Bipolar disorder ?5.  History of adenomatous colon polyps.  Surveillance up-to-date ? ? ?PLAN: ? ?1.  Long discussion today on alcohol related liver disease, fatty liver, and the potential for progression to cirrhosis.  Patient had multiple excellent questions.  Answered. ?2.  Exercise ?3.  Ongoing weight loss ?4.  Abstain from all alcohol ?5.  Other blood work to exclude possible important common causes for elevated liver test.  We will obtain chronic hepatitis serologies, tissue transglutaminase antibody IgA, and iron studies. ?6.  Surveillance colonoscopy around 2025 ?7.  Routine GI office follow-up in 1 year. ?A total time of 50 minutes was spent preparing to see the patient, reviewing multiple studies, laboratories, x-rays.  Obtaining comprehensive history.  Reviewing outside histories.  Performing comprehensive physical exam.  Counseling and educating the patient regarding the above listed issues.  Ordering blood work and directing lifestyle modification.  Establishing follow-up interval.  And, documenting clinical information in the health record. ? ? ? ?  ?

## 2021-06-19 NOTE — Patient Instructions (Addendum)
If you are age 59 or older, your body mass index should be between 23-30. Your Body mass index is 31.87 kg/m?Marland Kitchen If this is out of the aforementioned range listed, please consider follow up with your Primary Care Provider. ? ?If you are age 26 or younger, your body mass index should be between 19-25. Your Body mass index is 31.87 kg/m?Marland Kitchen If this is out of the aformentioned range listed, please consider follow up with your Primary Care Provider.  ? ?Your provider has requested that you go to the basement level for lab work before leaving today. Press "B" on the elevator. The lab is located at the first door on the left as you exit the elevator. ? ? ?Exercise daily for weight loss. ? ?Stop drinking alcohol.  ? ?The [facility] providers would like to encourage you to use [facility] to communicate with providers for non-urgent requests or questions.  Due to long hold times on the telephone, sending your provider a message by [facility] may be a faster and more efficient way to get a response.  Please allow 48 business hours for a response.  Please remember that this is for non-urgent requests.  ? ?Due to recent changes in healthcare laws, you may see the results of your imaging and laboratory studies on MyChart before your provider has had a chance to review them.  We understand that in some cases there may be results that are confusing or concerning to you. Not all laboratory results come back in the same time frame and the provider may be waiting for multiple results in order to interpret others.  Please give [place] 48 hours in order for your provider to thoroughly review all the results before contacting the office for clarification of your results.  ? ? ?It was a pleasure to see you today! ? ?Thank you for trusting me with your gastrointestinal care!   ? ?Scarlette Shorts , MD  ? ?

## 2021-06-20 LAB — HEPATITIS B SURFACE ANTIGEN: Hepatitis B Surface Ag: NONREACTIVE

## 2021-06-20 LAB — TISSUE TRANSGLUTAMINASE, IGA: (tTG) Ab, IgA: <1 U/mL

## 2021-06-20 LAB — HEPATITIS B SURFACE ANTIBODY,QUALITATIVE: Hep B S Ab: REACTIVE — AB

## 2021-06-20 LAB — HEPATITIS C ANTIBODY
Hepatitis C Ab: NONREACTIVE
SIGNAL TO CUT-OFF: 0.1 (ref <1.00)

## 2021-06-29 LAB — HM DIABETES EYE EXAM

## 2021-08-16 ENCOUNTER — Other Ambulatory Visit: Payer: Self-pay | Admitting: Internal Medicine

## 2021-08-16 DIAGNOSIS — E039 Hypothyroidism, unspecified: Secondary | ICD-10-CM

## 2021-08-31 ENCOUNTER — Telehealth: Payer: Self-pay | Admitting: Internal Medicine

## 2021-08-31 NOTE — Telephone Encounter (Signed)
Pt stated Dr. Henrene Pastor advised him to get some bloodwork done with PCP. Pt scheduled an appointment to discuss some personal health issues with Dr. Sharlet Salina and would like labs ordered at his appointment on 09/11/21.   Please advise.

## 2021-09-03 NOTE — Telephone Encounter (Signed)
Will assess need for labs at visit.

## 2021-09-11 ENCOUNTER — Encounter: Payer: Self-pay | Admitting: Internal Medicine

## 2021-09-11 ENCOUNTER — Ambulatory Visit (INDEPENDENT_AMBULATORY_CARE_PROVIDER_SITE_OTHER): Payer: Medicare Other | Admitting: Internal Medicine

## 2021-09-11 VITALS — BP 124/80 | HR 79 | Resp 18 | Ht 72.0 in | Wt 231.0 lb

## 2021-09-11 DIAGNOSIS — K760 Fatty (change of) liver, not elsewhere classified: Secondary | ICD-10-CM

## 2021-09-11 DIAGNOSIS — E1169 Type 2 diabetes mellitus with other specified complication: Secondary | ICD-10-CM | POA: Diagnosis not present

## 2021-09-11 DIAGNOSIS — Z79899 Other long term (current) drug therapy: Secondary | ICD-10-CM

## 2021-09-11 DIAGNOSIS — E785 Hyperlipidemia, unspecified: Secondary | ICD-10-CM | POA: Diagnosis not present

## 2021-09-11 DIAGNOSIS — E039 Hypothyroidism, unspecified: Secondary | ICD-10-CM

## 2021-09-11 DIAGNOSIS — F319 Bipolar disorder, unspecified: Secondary | ICD-10-CM

## 2021-09-11 DIAGNOSIS — E559 Vitamin D deficiency, unspecified: Secondary | ICD-10-CM | POA: Diagnosis not present

## 2021-09-11 LAB — MICROALBUMIN / CREATININE URINE RATIO
Creatinine,U: 80.7 mg/dL
Microalb Creat Ratio: 0.9 mg/g (ref 0.0–30.0)
Microalb, Ur: <0.7 mg/dL (ref 0.0–1.9)

## 2021-09-11 LAB — COMPREHENSIVE METABOLIC PANEL
ALT: 38 U/L (ref 0–53)
AST: 29 U/L (ref 0–37)
Albumin: 4.4 g/dL (ref 3.5–5.2)
Alkaline Phosphatase: 42 U/L (ref 39–117)
BUN: 20 mg/dL (ref 6–23)
CO2: 28 mEq/L (ref 19–32)
Calcium: 9.7 mg/dL (ref 8.4–10.5)
Chloride: 105 mEq/L (ref 96–112)
Creatinine, Ser: 1.01 mg/dL (ref 0.40–1.50)
GFR: 81.45 mL/min (ref >60.00)
Glucose, Bld: 126 mg/dL — ABNORMAL HIGH (ref 70–99)
Potassium: 4.3 mEq/L (ref 3.5–5.1)
Sodium: 139 mEq/L (ref 135–145)
Total Bilirubin: 0.4 mg/dL (ref 0.2–1.2)
Total Protein: 7.2 g/dL (ref 6.0–8.3)

## 2021-09-11 LAB — LIPID PANEL
Cholesterol: 165 mg/dL (ref 0–200)
HDL: 43.1 mg/dL (ref >39.00)
LDL Cholesterol: 83 mg/dL (ref 0–99)
NonHDL: 121.7
Total CHOL/HDL Ratio: 4
Triglycerides: 196 mg/dL — ABNORMAL HIGH (ref 0.0–149.0)
VLDL: 39.2 mg/dL (ref 0.0–40.0)

## 2021-09-11 LAB — HEMOGLOBIN A1C: Hgb A1c MFr Bld: 6.6 % — ABNORMAL HIGH (ref 4.6–6.5)

## 2021-09-11 LAB — TSH: TSH: 0.33 u[IU]/mL — ABNORMAL LOW (ref 0.35–5.50)

## 2021-09-11 LAB — VITAMIN D 25 HYDROXY (VIT D DEFICIENCY, FRACTURES): VITD: 17.93 ng/mL — ABNORMAL LOW (ref 30.00–100.00)

## 2021-09-11 NOTE — Assessment & Plan Note (Signed)
Checking vitamin D level for psych provider. Adjust as needed.

## 2021-09-11 NOTE — Progress Notes (Signed)
   Subjective:   Patient ID: Mark Booth, male    DOB: [birth year], 61 y.o.   MRN: [id number]  HPI The patient is a 61 YO man coming in for follow up concerns.   Review of Systems  Constitutional: Negative.   HENT: Negative.    Eyes: Negative.   Respiratory:  Negative for cough, chest tightness and shortness of breath.   Cardiovascular:  Negative for chest pain, palpitations and leg swelling.  Gastrointestinal:  Negative for abdominal distention, abdominal pain, constipation, diarrhea, nausea and vomiting.       GERD  Musculoskeletal: Negative.   Skin: Negative.   Neurological: Negative.   Psychiatric/Behavioral: Negative.      Objective:  Physical Exam Constitutional:      Appearance: He is well-developed.  HENT:     Head: Normocephalic and atraumatic.  Cardiovascular:     Rate and Rhythm: Normal rate and regular rhythm.  Pulmonary:     Effort: Pulmonary effort is normal. No respiratory distress.     Breath sounds: Normal breath sounds. No wheezing or rales.  Abdominal:     General: Bowel sounds are normal. There is no distension.     Palpations: Abdomen is soft.     Tenderness: There is no abdominal tenderness. There is no rebound.  Musculoskeletal:     Cervical back: Normal range of motion.  Skin:    General: Skin is warm and dry.     Comments: Foot exam done  Neurological:     Mental Status: He is alert and oriented to person, place, and time.     Coordination: Coordination normal.     Vitals:   09/11/21 1009  BP: 124/80  Pulse: 79  Resp: 18  SpO2: 96%  Weight: 231 lb (104.8 kg)  Height: 6' (1.829 m)    Assessment & Plan:

## 2021-09-11 NOTE — Assessment & Plan Note (Signed)
Checking TSH and adjust synthroid 150 mcg daily as needed. 

## 2021-09-11 NOTE — Patient Instructions (Signed)
We will check the labs today. 

## 2021-09-11 NOTE — Assessment & Plan Note (Signed)
Has seen GI recently and checking CMP for stability. Advised not to drink alcohol to lower chance of progression to cirrhosis.

## 2021-09-11 NOTE — Assessment & Plan Note (Signed)
Checking lipid panel and adjust simvastatin 40 mg daily as needed.  

## 2021-09-11 NOTE — Assessment & Plan Note (Signed)
Checking CBC and CMP and lipid and TSH and vitamin D per psych provider request. He does see them for management of his mood and feels stable with that today.

## 2021-09-11 NOTE — Assessment & Plan Note (Signed)
Foot exam done, checking HgA1c, microalbumin to creatinine ratio and CMP and lipid panel. Adjust rybelsus 14 mg daily and is on ACE-I and statin.

## 2021-09-12 ENCOUNTER — Other Ambulatory Visit: Payer: Self-pay | Admitting: Internal Medicine

## 2021-09-12 MED ORDER — VITAMIN D (ERGOCALCIFEROL) 1.25 MG (50000 UNIT) PO CAPS: 50000.0000 [IU] | ORAL_CAPSULE | ORAL | 0 refills | Status: DC

## 2021-09-30 ENCOUNTER — Encounter (HOSPITAL_COMMUNITY)
Admission: EM | Disposition: A | Discharge status: Home / Self Care | Payer: Self-pay | Source: Home / Self Care | Attending: Emergency Medicine

## 2021-09-30 ENCOUNTER — Encounter: Payer: Self-pay | Admitting: Internal Medicine

## 2021-09-30 ENCOUNTER — Emergency Department (HOSPITAL_COMMUNITY): Payer: Medicare Other | Admitting: Anesthesiology

## 2021-09-30 ENCOUNTER — Emergency Department (HOSPITAL_COMMUNITY)
Admission: EM | Admit: 2021-09-30 | Discharge: 2021-09-30 | Disposition: A | Discharge status: Home / Self Care | Payer: Medicare Other | Attending: Emergency Medicine | Admitting: Emergency Medicine

## 2021-09-30 ENCOUNTER — Emergency Department (EMERGENCY_DEPARTMENT_HOSPITAL): Payer: Medicare Other | Admitting: Anesthesiology

## 2021-09-30 ENCOUNTER — Encounter (HOSPITAL_COMMUNITY): Payer: Self-pay

## 2021-09-30 ENCOUNTER — Other Ambulatory Visit: Payer: Self-pay

## 2021-09-30 DIAGNOSIS — E559 Vitamin D deficiency, unspecified: Secondary | ICD-10-CM | POA: Insufficient documentation

## 2021-09-30 DIAGNOSIS — K269 Duodenal ulcer, unspecified as acute or chronic, without hemorrhage or perforation: Secondary | ICD-10-CM

## 2021-09-30 DIAGNOSIS — J45909 Unspecified asthma, uncomplicated: Secondary | ICD-10-CM | POA: Insufficient documentation

## 2021-09-30 DIAGNOSIS — T18128A Food in esophagus causing other injury, initial encounter: Secondary | ICD-10-CM | POA: Diagnosis present

## 2021-09-30 DIAGNOSIS — F319 Bipolar disorder, unspecified: Secondary | ICD-10-CM | POA: Insufficient documentation

## 2021-09-30 DIAGNOSIS — X58XXXA Exposure to other specified factors, initial encounter: Secondary | ICD-10-CM | POA: Diagnosis not present

## 2021-09-30 DIAGNOSIS — I10 Essential (primary) hypertension: Secondary | ICD-10-CM | POA: Diagnosis not present

## 2021-09-30 DIAGNOSIS — R1319 Other dysphagia: Secondary | ICD-10-CM

## 2021-09-30 DIAGNOSIS — Z79899 Other long term (current) drug therapy: Secondary | ICD-10-CM | POA: Diagnosis not present

## 2021-09-30 DIAGNOSIS — K2100 Gastro-esophageal reflux disease with esophagitis, without bleeding: Secondary | ICD-10-CM

## 2021-09-30 DIAGNOSIS — W44F3XA Food entering into or through a natural orifice, initial encounter: Secondary | ICD-10-CM

## 2021-09-30 DIAGNOSIS — Z86010 Personal history of colonic polyps: Secondary | ICD-10-CM | POA: Diagnosis not present

## 2021-09-30 DIAGNOSIS — E039 Hypothyroidism, unspecified: Secondary | ICD-10-CM | POA: Insufficient documentation

## 2021-09-30 DIAGNOSIS — K760 Fatty (change of) liver, not elsewhere classified: Secondary | ICD-10-CM | POA: Insufficient documentation

## 2021-09-30 DIAGNOSIS — E785 Hyperlipidemia, unspecified: Secondary | ICD-10-CM | POA: Diagnosis not present

## 2021-09-30 DIAGNOSIS — K209 Esophagitis, unspecified without bleeding: Secondary | ICD-10-CM

## 2021-09-30 HISTORY — PX: BIOPSY: SHX5522

## 2021-09-30 HISTORY — PX: ESOPHAGOGASTRODUODENOSCOPY (EGD) WITH PROPOFOL: SHX5813

## 2021-09-30 LAB — CBC WITH DIFFERENTIAL/PLATELET
Abs Immature Granulocytes: 0.03 10*3/uL (ref 0.00–0.07)
Basophils Absolute: 0 10*3/uL (ref 0.0–0.1)
Basophils Relative: 0 %
Eosinophils Absolute: 0.4 10*3/uL (ref 0.0–0.5)
Eosinophils Relative: 4 %
HCT: 43.3 % (ref 39.0–52.0)
Hemoglobin: 14.9 g/dL (ref 13.0–17.0)
Immature Granulocytes: 0 %
Lymphocytes Relative: 17 %
Lymphs Abs: 2 10*3/uL (ref 0.7–4.0)
MCH: 30.8 pg (ref 26.0–34.0)
MCHC: 34.4 g/dL (ref 30.0–36.0)
MCV: 89.6 fL (ref 80.0–100.0)
Monocytes Absolute: 0.7 10*3/uL (ref 0.1–1.0)
Monocytes Relative: 6 %
Neutro Abs: 8.2 10*3/uL — ABNORMAL HIGH (ref 1.7–7.7)
Neutrophils Relative %: 73 %
Platelets: 348 10*3/uL (ref 150–400)
RBC: 4.83 MIL/uL (ref 4.22–5.81)
RDW: 12.3 % (ref 11.5–15.5)
WBC: 11.4 10*3/uL — ABNORMAL HIGH (ref 4.0–10.5)
nRBC: 0 % (ref 0.0–0.2)

## 2021-09-30 LAB — COMPREHENSIVE METABOLIC PANEL
ALT: 48 U/L — ABNORMAL HIGH (ref 0–44)
AST: 49 U/L — ABNORMAL HIGH (ref 15–41)
Albumin: 4.5 g/dL (ref 3.5–5.0)
Alkaline Phosphatase: 48 U/L (ref 38–126)
Anion gap: 13 (ref 5–15)
BUN: 28 mg/dL — ABNORMAL HIGH (ref 6–20)
CO2: 23 mmol/L (ref 22–32)
Calcium: 9.7 mg/dL (ref 8.9–10.3)
Chloride: 105 mmol/L (ref 98–111)
Creatinine, Ser: 1.18 mg/dL (ref 0.61–1.24)
GFR, Estimated: >60 mL/min (ref >60)
Glucose, Bld: 121 mg/dL — ABNORMAL HIGH (ref 70–99)
Potassium: 3.7 mmol/L (ref 3.5–5.1)
Sodium: 141 mmol/L (ref 135–145)
Total Bilirubin: 0.9 mg/dL (ref 0.3–1.2)
Total Protein: 8 g/dL (ref 6.5–8.1)

## 2021-09-30 LAB — LIPASE, BLOOD: Lipase: 27 U/L (ref 11–51)

## 2021-09-30 LAB — GLUCOSE, CAPILLARY: Glucose-Capillary: 105 mg/dL — ABNORMAL HIGH (ref 70–99)

## 2021-09-30 SURGERY — ESOPHAGOGASTRODUODENOSCOPY (EGD) WITH PROPOFOL
Anesthesia: General

## 2021-09-30 MED ORDER — PANTOPRAZOLE SODIUM 40 MG PO TBEC: 40.0000 mg | DELAYED_RELEASE_TABLET | Freq: Every morning | ORAL | 3 refills | Status: DC

## 2021-09-30 MED ORDER — MIDAZOLAM HCL 5 MG/5ML IJ SOLN
INTRAMUSCULAR | Status: DC | PRN
  Administered 2021-09-30: 2 mg via INTRAVENOUS

## 2021-09-30 MED ORDER — PROPOFOL 10 MG/ML IV BOLUS
INTRAVENOUS | Status: DC | PRN
  Administered 2021-09-30: 30 mg via INTRAVENOUS
  Administered 2021-09-30: 170 mg via INTRAVENOUS

## 2021-09-30 MED ORDER — LIDOCAINE 2% (20 MG/ML) 5 ML SYRINGE
INTRAMUSCULAR | Status: DC | PRN
  Administered 2021-09-30: 100 mg via INTRAVENOUS

## 2021-09-30 MED ORDER — GLUCAGON HCL RDNA (DIAGNOSTIC) 1 MG IJ SOLR
1.0000 mg | Freq: Once | INTRAMUSCULAR | Status: AC
  Administered 2021-09-30: 1 mg via INTRAVENOUS
  Filled 2021-09-30: qty 1

## 2021-09-30 MED ORDER — SUCCINYLCHOLINE CHLORIDE 200 MG/10ML IV SOSY
PREFILLED_SYRINGE | INTRAVENOUS | Status: DC | PRN
  Administered 2021-09-30: 140 mg via INTRAVENOUS

## 2021-09-30 MED ORDER — MIDAZOLAM HCL 2 MG/2ML IJ SOLN
INTRAMUSCULAR | Status: AC
  Filled 2021-09-30: qty 2

## 2021-09-30 MED ORDER — SODIUM CHLORIDE 0.9 % IV BOLUS
1000.0000 mL | Freq: Once | INTRAVENOUS | Status: AC
  Administered 2021-09-30: 1000 mL via INTRAVENOUS

## 2021-09-30 MED ORDER — FENTANYL CITRATE (PF) 100 MCG/2ML IJ SOLN
INTRAMUSCULAR | Status: DC | PRN
Start: 2021-09-30 — End: 2021-09-30
  Administered 2021-09-30: 50 ug via INTRAVENOUS

## 2021-09-30 MED ORDER — FENTANYL CITRATE (PF) 100 MCG/2ML IJ SOLN
INTRAMUSCULAR | Status: AC
  Filled 2021-09-30: qty 2

## 2021-09-30 MED ORDER — AMISULPRIDE (ANTIEMETIC) 5 MG/2ML IV SOLN: 10.0000 mg | Freq: Once | INTRAVENOUS | Status: DC | PRN

## 2021-09-30 MED ORDER — DIPHENHYDRAMINE HCL 50 MG/ML IJ SOLN
25.0000 mg | Freq: Once | INTRAMUSCULAR | Status: AC
  Administered 2021-09-30: 25 mg via INTRAVENOUS
  Filled 2021-09-30: qty 1

## 2021-09-30 MED ORDER — METOCLOPRAMIDE HCL 5 MG/ML IJ SOLN
10.0000 mg | Freq: Once | INTRAMUSCULAR | Status: AC
  Administered 2021-09-30: 10 mg via INTRAVENOUS
  Filled 2021-09-30: qty 2

## 2021-09-30 MED ORDER — FENTANYL CITRATE (PF) 100 MCG/2ML IJ SOLN: 25.0000 ug | INTRAMUSCULAR | Status: DC | PRN

## 2021-09-30 MED ORDER — ALUM & MAG HYDROXIDE-SIMETH 200-200-20 MG/5ML PO SUSP
30.0000 mL | Freq: Once | ORAL | Status: DC
  Filled 2021-09-30: qty 30

## 2021-09-30 MED ORDER — ONDANSETRON HCL 4 MG/2ML IJ SOLN
INTRAMUSCULAR | Status: DC | PRN
  Administered 2021-09-30: 4 mg via INTRAVENOUS

## 2021-09-30 MED ORDER — LACTATED RINGERS IV SOLN: INTRAVENOUS | Status: DC | PRN

## 2021-09-30 MED ORDER — PROPOFOL 10 MG/ML IV BOLUS
INTRAVENOUS | Status: AC
  Filled 2021-09-30: qty 20

## 2021-09-30 SURGICAL SUPPLY — 15 items

## 2021-09-30 NOTE — ED Provider Triage Note (Signed)
Emergency Medicine Provider Triage Evaluation Note  Mark Booth , a 61 y.o. male  was evaluated in triage.  Pt complains of dysphagia. Pt report after eating bratwurst 2 days ago he felt it was stuck in the back of his throat and since then he's unable to swallow liquid or solid.  No associated pain, no sob, cp, abd pain.  Report having to spit his saliva.  Has had similar episodes like this in the past but usually resolved.  GI is Dr. Henrene Pastor   Review of Systems  Positive: As above Negative: As above  Physical Exam  BP (!) 142/93 (BP Location: Left Arm)   Pulse 88   Temp 98 F (36.7 C) (Oral)   Resp 16   Ht 6' (1.829 m)   Wt 103.4 kg   SpO2 99%   BMI 30.92 kg/m  Gen:   Awake, no distress   Resp:  Normal effort  MSK:   Moves extremities without difficulty  Other:    Medical Decision Making  Medically screening exam initiated at 3:07 PM.  Appropriate orders placed.  Mark Booth was informed that the remainder of the evaluation will be completed by another provider, this initial triage assessment does not replace that evaluation, and the importance of remaining in the ED until their evaluation is complete.     Domenic Moras, PA-C 09/30/21 [phone]

## 2021-09-30 NOTE — Anesthesia Procedure Notes (Signed)
Procedure Name: Intubation Date/Time: 09/30/2021 5:57 PM  Performed by: Gean Maidens, CRNAPre-anesthesia Checklist: Patient identified, Emergency Drugs available, Suction available, Patient being monitored and Timeout performed Patient Re-evaluated:Patient Re-evaluated prior to induction Oxygen Delivery Method: Circle system utilized Preoxygenation: Pre-oxygenation with 100% oxygen Induction Type: IV induction and Rapid sequence Laryngoscope Size: Mac and 4 Grade View: Grade I Tube type: Oral Tube size: 7.5 mm Number of attempts: 1 Airway Equipment and Method: Stylet Placement Confirmation: ETT inserted through vocal cords under direct vision, positive ETCO2 and breath sounds checked- equal and bilateral Secured at: 23 cm Tube secured with: Tape Dental Injury: Teeth and Oropharynx as per pre-operative assessment

## 2021-09-30 NOTE — Op Note (Addendum)
[facility] Patient Name: Mark Booth Procedure Date: 09/30/2021 MRN: [id number] Attending MD: Thornton Park MD, MD Date of Birth: Dec 02, 1960 CSN: [id number] Age: 61 Admit Type: Outpatient Procedure:                Upper GI endoscopy Indications:              Foreign body in the esophagus Providers:                Thornton Park MD, MD, Doristine Johns, RN,                            William Dalton, Technician Referring MD:              Medicines:                Monitored Anesthesia Care Complications:            No immediate complications. Estimated Blood Loss:     Estimated blood loss was minimal. Procedure:                Pre-Anesthesia Assessment:                           - Prior to the procedure, a History and Physical                            was performed, and patient medications and                            allergies were reviewed. The patient's tolerance of                            previous anesthesia was also reviewed. The risks                            and benefits of the procedure and the sedation                            options and risks were discussed with the patient.                            All questions were answered, and informed consent                            was obtained. Prior Anticoagulants: The patient has                            taken no previous anticoagulant or antiplatelet                            agents. ASA Grade Assessment: II - A patient with                            mild systemic disease. After reviewing the risks  and benefits, the patient was deemed in                            satisfactory condition to undergo the procedure.                           After obtaining informed consent, the endoscope was                            passed under direct vision. Throughout the                            procedure, the patient's blood pressure, pulse, and                             oxygen saturations were monitored continuously. The                            [id number]-[id number] ([id number]) Olympus endoscope was introduced                            through the mouth, and advanced to the third part                            of duodenum. The upper GI endoscopy was                            accomplished without difficulty. The patient                            tolerated the procedure well. Scope In: Scope Out: Findings:      LA Grade C (one or more mucosal breaks continuous between tops of 2 or       more mucosal folds, less than 75% circumference) esophagitis with no       bleeding was found 36 cm from the incisors. There is extensive       congestion in the distal esophagus with no obvious stricture. Biopsies       were taken from the mid/proximal and distal esophagus with a cold       forceps for histology. Estimated blood loss was minimal.      A large piece of food was in the stomach. The entire examined gastric       mucosa was normal. Biopsies were taken from the antrum, body, and fundus       with a cold forceps for histology. Estimated blood loss was minimal.      Six non-bleeding cratered duodenal ulcers with no stigmata of bleeding       were found in the duodenal bulb and in the second portion of the       duodenum. The largest lesion was 8 mm in largest dimension.      The cardia and gastric fundus were normal on retroflexion.      The exam was otherwise without abnormality. Impression:               - LA Grade C reflux esophagitis with no bleeding.  Biopsied to evaluate for reflux and EOE.                           - Normal stomach mucosa. Biopsied for H pylori.                           - Large piece of food in the stomach - thought to                            be what was causing the esophageal impaction.                           - Non-bleeding duodenal ulcers with no stigmata of                            bleeding.                            - The examination was otherwise normal. Moderate Sedation:      Not Applicable - Patient had care per Anesthesia. Recommendation:           - Patient has a contact number available for                            emergencies. The signs and symptoms of potential                            delayed complications were discussed with the                            patient. Return to normal activities tomorrow.                            Written discharge instructions were provided to the                            patient.                           - Advance diet as tolerated.                           - Take small bites, chew well, eat slowly, and take                            sips of water between bites of food.                           - Continue present medications.                           - Avoid all NSAIDs.                           - Start pantoprazole 40 mg every morning x 3 months.                           -  Await pathology results.                           - Consider repeat EGD in 10-12 weeks to document                            healing.                           - Office follow-up with Dr. Henrene Pastor 10/09/21 at 2:40                            as previously planned. Procedure Code(s):        --- Professional ---                           [phone], Esophagogastroduodenoscopy, flexible,                            transoral; with biopsy, single or multiple Diagnosis Code(s):        --- Professional ---                           K21.00, Gastro-esophageal reflux disease with                            esophagitis, without bleeding                           K26.9, Duodenal ulcer, unspecified as acute or                            chronic, without hemorrhage or perforation                           T18.108A, Unspecified foreign body in esophagus                            causing other injury, initial encounter CPT copyright 2019 [employer]. All rights  reserved. The codes documented in this report are preliminary and upon coder review may  be revised to meet current compliance requirements. Thornton Park MD, MD 09/30/2021 6:20:18 PM This report has been signed electronically. Number of Addenda: 0

## 2021-09-30 NOTE — ED Provider Notes (Signed)
[facility] DEPT Provider Note   CSN: [id number] Arrival date & time: 09/30/21  1441     History  Chief Complaint  Patient presents with   Food bolus    Mark Booth is a 61 y.o. male history of hypertension, here presenting with possible food impaction.  Patient was eating some bratwurst on Friday and felt something caught in his throat.  He states that he was not able to tolerate any secretions or any fluids or food since then.  Patient has no history of food impaction.  He saw Dr. Henrene Pastor previously for GI.  He message his primary care doctor and also a GI doctor was sent in for further evaluation.  The history is provided by the patient.       Home Medications Prior to Admission medications   Medication Sig Start Date End Date Taking? Authorizing Provider  albuterol (VENTOLIN HFA) 108 (90 Base) MCG/ACT inhaler INHALE 2 PUFFS BY MOUTH EVERY 6 HOURS AS NEEDED 06/17/19   Hoyt Koch, MD  ARIPiprazole (ABILIFY) 10 MG tablet Take 10 mg by mouth daily.    [provider]  Blood Glucose Monitoring Suppl (CONTOUR NEXT ONE) DEVI 1 Device by Does not apply route daily. 12/20/20   Hoyt Koch, MD  citalopram (CELEXA) 40 MG tablet Take 40 mg by mouth at bedtime. 12/06/20   [provider]  fenofibrate 160 MG tablet Take 1 tablet by mouth once daily 05/31/21   Hoyt Koch, MD  Fluticasone-Salmeterol (ADVAIR) 100-50 MCG/DOSE AEPB Inhale 1 puff into the lungs 2 (two) times daily. 06/17/19   Hoyt Koch, MD  glucose blood (CONTOUR NEXT TEST) test strip 1 each by Other route daily. Use as instructed 12/20/20   Hoyt Koch, MD  lamoTRIgine (LAMICTAL) 100 MG tablet Take 100 mg by mouth daily.    [provider]  levothyroxine (SYNTHROID) 150 MCG tablet TAKE 1 TABLET BY MOUTH ONCE DAILY BEFORE BREAKFAST 08/16/21   Hoyt Koch, MD  lisinopril-hydrochlorothiazide (ZESTORETIC) 10-12.5 MG tablet  Take 1 tablet by mouth once daily 06/15/21   Hoyt Koch, MD  Microlet Lancets MISC 1 Stick by Does not apply route daily. 12/21/20   Hoyt Koch, MD  nebivolol (BYSTOLIC) 10 MG tablet Take 1 tablet (10 mg total) by mouth daily. 05/31/21   Hoyt Koch, MD  Semaglutide (RYBELSUS) 14 MG TABS Take 1 tablet (14 mg total) by mouth daily. 06/01/21   Hoyt Koch, MD  simvastatin (ZOCOR) 40 MG tablet Take 1 tablet (40 mg total) by mouth daily at 6 PM. Annual appt W/LABS ARE due in OCT must see provider for future refills 06/07/21   Hoyt Koch, MD  tamsulosin (FLOMAX) 0.4 MG CAPS capsule Take 1 capsule (0.4 mg total) by mouth daily. 03/02/21   Hoyt Koch, MD  Vitamin D, Ergocalciferol, (DRISDOL) 1.25 MG (50000 UNIT) CAPS capsule Take 1 capsule (50,000 Units total) by mouth every 7 (seven) days. 09/12/21   Hoyt Koch, MD      Allergies    Patient has no known allergies.    Review of Systems   Review of Systems  Respiratory:  Positive for choking.   All other systems reviewed and are negative.   Physical Exam Updated Vital Signs BP (!) 142/93 (BP Location: Left Arm)   Pulse 88   Temp 98 F (36.7 C) (Oral)   Resp 16   Ht 6' (1.829 m)   Wt 103.4  kg   SpO2 99%   BMI 30.92 kg/m  Physical Exam Vitals and nursing note reviewed.  Constitutional:      Appearance: Normal appearance.  HENT:     Head: Normocephalic.     Nose: Nose normal.     Mouth/Throat:     Comments: No obvious foreign body in posterior pharynx  Eyes:     Extraocular Movements: Extraocular movements intact.     Pupils: Pupils are equal, round, and reactive to light.  Cardiovascular:     Rate and Rhythm: Normal rate and regular rhythm.     Pulses: Normal pulses.     Heart sounds: Normal heart sounds.  Pulmonary:     Effort: Pulmonary effort is normal.     Breath sounds: Normal breath sounds.  Abdominal:     General: Abdomen is flat.     Palpations:  Abdomen is soft.  Musculoskeletal:        General: Normal range of motion.     Cervical back: Normal range of motion and neck supple.  Skin:    General: Skin is warm.     Capillary Refill: Capillary refill takes less than 2 seconds.  Neurological:     General: No focal deficit present.     Mental Status: He is alert and oriented to person, place, and time.  Psychiatric:        Mood and Affect: Mood normal.        Behavior: Behavior normal.     ED Results / Procedures / Treatments   Labs (all labs ordered are listed, but only abnormal results are displayed) Labs Reviewed  CBC WITH DIFFERENTIAL/PLATELET - Abnormal; Notable for the following components:      Result Value   WBC 11.4 (*)    Neutro Abs 8.2 (*)    All other components within normal limits  COMPREHENSIVE METABOLIC PANEL  LIPASE, BLOOD    EKG None  Radiology No results found.  Procedures Procedures    Medications Ordered in ED Medications  metoCLOPramide (REGLAN) injection 10 mg (has no administration in time range)  diphenhydrAMINE (BENADRYL) injection 25 mg (has no administration in time range)  glucagon (human recombinant) (GLUCAGEN) injection 1 mg (has no administration in time range)  sodium chloride 0.9 % bolus 1,000 mL (has no administration in time range)    ED Course/ Medical Decision Making/ A&P                           Medical Decision Making Mark Booth is a 61 y.o. male here with possible esophageal food impaction.  Patient ate some sausage and now cannot tolerate secretions for over 24 hours. We will try glucagon and will get basic labs and will likely need to consult to GI if he is not able to tolerate secretions.  Concern for possible food impaction   5 pm Patient still not feeling great. Dr. Tarri Glenn from GI saw patient and will take patient for endoscopy   Amount and/or Complexity of Data Reviewed Labs: ordered. Decision-making details documented in ED Course.  Risk OTC  drugs. Prescription drug management.    Final Clinical Impression(s) / ED Diagnoses Final diagnoses:  None    Rx / DC Orders ED Discharge Orders     None         Drenda Freeze, MD 09/30/21 1821

## 2021-09-30 NOTE — Consult Note (Signed)
Referring Provider: Elvina Sidle, ER Primary Care Physician:  Hoyt Koch, MD Primary Gastroenterologist:  Dr.John Henrene Pastor  Reason for Consultation:  Possible food impaction   IMPRESSION:  Possible food impaction with inability to tolerate solids or liquids over the last 48 hours  Recent reflux requiring more frequent use of famotidine   PLAN: Reflux lifestyle modifications EGD with removal of possible food impaction Plan to initiate pantoprazole 40 mg every morning on discharge  I consented the patient discussing the risks, benefits, and alternatives to endoscopic evaluation. In particular, we discussed the risks that include, but are not limited to, reaction to medication, bleeding requiring blood transfusion, aspiration resulting in pneumonia, perforation requiring surgery. The patient acknowledges these risks and asks that we proceed.    HPI: Mark Booth is a 61 y.o. male seen in consultation at the request of Dr. Darl Householder for further evaluation of possible food impaction.  The history is obtained through the patient and review of his electronic health record.  He has a history of allergies treated with Advair as needed and allergic rhinitis as well as bipolar, depression, hyperlipidemia, hypertension, hypothyroidism, vitamin D deficiency, and fatty liver.  He was eating bratwurst around 5 PM on Friday while talking on the phone.  Felt a piece lodged in his throat and has been unable to swallow his secretions, solids or liquids since that time.  He has not required a spittoon but has been getting up and spitting in the sink in the ER exam room as needed.  A trial of glucagon in the ER this afternoon provided no additional relief.  The current sensation of this of right-sided chest pain.  He has had similar intermittent symptoms where he feels a food bolus get stuck in his throat.  With enough time it will pass.  He has never sought care for a possible food impaction  before.  He has had intermittent reflux that he has been treating with famotidine.  Has been occurring more frequently.  At most this happens 2-3 times a week and mostly at night.  There is no prior use of prescription therapy for reflux.  No prior upper endoscopy.  He has a history of colon polyps and had a colonoscopy with Dr. Henrene Pastor in January 2015 and October 2020.  Surveillance is currently up-to-date with repeat colonoscopy recommended in 2025.   Past Medical History:  Diagnosis Date   Allergic rhinitis    Allergy    AS A CHILD   Asthma    Bipolar disorder ([place])    Cataract    BILATERAL-REMOVED   Depression    Hyperlipidemia    Hypertension    Hypothyroidism    Osteoarthritis    Overweight(278.02)    Unspecified vitamin D deficiency     Past Surgical History:  Procedure Laterality Date   APPENDECTOMY     CATARACT EXTRACTION  12-2012   BILATERAL   COLONOSCOPY     POLYPECTOMY     SHOULDER SURGERY  2013    Current Facility-Administered Medications  Medication Dose Route Frequency Provider Last Rate Last Admin   alum & mag hydroxide-simeth (MAALOX/MYLANTA) 200-200-20 MG/5ML suspension 30 mL  30 mL Oral Once Drenda Freeze, MD       Current Outpatient Medications  Medication Sig Dispense Refill   albuterol (VENTOLIN HFA) 108 (90 Base) MCG/ACT inhaler INHALE 2 PUFFS BY MOUTH EVERY 6 HOURS AS NEEDED 18 g 1   ARIPiprazole (ABILIFY) 10 MG tablet Take 10 mg by mouth daily.  Blood Glucose Monitoring Suppl (CONTOUR NEXT ONE) DEVI 1 Device by Does not apply route daily. 1 each 0   citalopram (CELEXA) 40 MG tablet Take 40 mg by mouth at bedtime.     fenofibrate 160 MG tablet Take 1 tablet by mouth once daily 90 tablet 1   Fluticasone-Salmeterol (ADVAIR) 100-50 MCG/DOSE AEPB Inhale 1 puff into the lungs 2 (two) times daily. 1 each 11   glucose blood (CONTOUR NEXT TEST) test strip 1 each by Other route daily. Use as instructed 100 each 12   lamoTRIgine (LAMICTAL) 100 MG  tablet Take 100 mg by mouth daily.     levothyroxine (SYNTHROID) 150 MCG tablet TAKE 1 TABLET BY MOUTH ONCE DAILY BEFORE BREAKFAST 90 tablet 0   lisinopril-hydrochlorothiazide (ZESTORETIC) 10-12.5 MG tablet Take 1 tablet by mouth once daily 30 tablet 5   Microlet Lancets MISC 1 Stick by Does not apply route daily. 100 each 2   nebivolol (BYSTOLIC) 10 MG tablet Take 1 tablet (10 mg total) by mouth daily. 90 tablet 3   Semaglutide (RYBELSUS) 14 MG TABS Take 1 tablet (14 mg total) by mouth daily. 90 tablet 3   simvastatin (ZOCOR) 40 MG tablet Take 1 tablet (40 mg total) by mouth daily at 6 PM. Annual appt W/LABS ARE due in OCT must see provider for future refills 90 tablet 1   tamsulosin (FLOMAX) 0.4 MG CAPS capsule Take 1 capsule (0.4 mg total) by mouth daily. 90 capsule 3   Vitamin D, Ergocalciferol, (DRISDOL) 1.25 MG (50000 UNIT) CAPS capsule Take 1 capsule (50,000 Units total) by mouth every 7 (seven) days. 12 capsule 0    Allergies as of 09/30/2021   (No Known Allergies)    Family History  Problem Relation Age of Onset   Osteoporosis Mother    Depression Mother    Diabetes Mother    Hypothyroidism Mother    Pancreatic cancer Mother    Prostate cancer Father    Colon cancer Paternal Grandfather    Rectal cancer Neg Hx    Stomach cancer Neg Hx    Esophageal cancer Neg Hx     Social History   Socioeconomic History   Marital status: Single    Spouse name: Not on file   Number of children: Not on file   Years of education: Not on file   Highest education level: Not on file  Occupational History   Occupation: Unemployed  Tobacco Use   Smoking status: Never   Smokeless tobacco: Never  Vaping Use   Vaping Use: Never used  Substance and Sexual Activity   Alcohol use: Yes    Alcohol/week: 42.0 standard drinks of alcohol    Types: 42 Cans of beer per week   Drug use: No   Sexual activity: Not Currently  Other Topics Concern   Not on file  Social History Narrative   Not on  file   Social Determinants of Health   Financial Resource Strain: Low Risk  (05/07/2021)   Overall Financial Resource Strain (CARDIA)    Difficulty of Paying Living Expenses: Not hard at all  Food Insecurity: No Food Insecurity (05/07/2021)   Hunger Vital Sign    Worried About Running Out of Food in the Last Year: Never true    Ran Out of Food in the Last Year: Never true  Transportation Needs: No Transportation Needs (05/07/2021)   PRAPARE - Hydrologist (Medical): No    Lack of Transportation (Non-Medical): No  Physical Activity: Inactive (05/07/2021)   Exercise Vital Sign    Days of Exercise per Week: 0 days    Minutes of Exercise per Session: 0 min  Stress: No Stress Concern Present (05/07/2021)   [place]    Feeling of Stress : Not at all  Social Connections: Socially Isolated (05/07/2021)   Social Connection and Isolation Panel [NHANES]    Frequency of Communication with Friends and Family: Twice a week    Frequency of Social Gatherings with Friends and Family: Twice a week    Attends Religious Services: Never    Marine scientist or Organizations: No    Attends Archivist Meetings: Never    Marital Status: Never married  Intimate Partner Violence: Not At Risk (05/07/2021)   Humiliation, Afraid, Rape, and Kick questionnaire    Fear of Current or Ex-Partner: No    Emotionally Abused: No    Physically Abused: No    Sexually Abused: No    Physical Exam: General:   Alert,  well-nourished, pleasant and cooperative in NAD, he is speaking complete sentences.  No intolerance to saliva during my interaction with him.  No spittoon at the bedside. Head:  Normocephalic and atraumatic. Eyes:  Sclera clear, no icterus.   Conjunctiva pink. Ears:  Normal auditory acuity. Nose:  No deformity, discharge,  or lesions. Mouth:  No deformity or lesions.   Neck:  Supple; no masses or  thyromegaly. Lungs:  Clear throughout to auscultation.   No wheezes. Heart:  Regular rate and rhythm; no murmurs. Abdomen:  Soft, nontender, nondistended, normal bowel sounds, no rebound or guarding. No hepatosplenomegaly.   Rectal:  Deferred  Msk:  Symmetrical. No boney deformities LAD: No inguinal or umbilical LAD Extremities:  No clubbing or edema. Neurologic:  Alert and  oriented x4;  grossly nonfocal Skin:  Intact without significant lesions or rashes. Psych:  Alert and cooperative. Normal mood and affect.   Lab Results: Recent Labs    09/30/21 1519  WBC 11.4*  HGB 14.9  HCT 43.3  PLT 348   BMET Recent Labs    09/30/21 1519  NA 141  K 3.7  CL 105  CO2 23  GLUCOSE 121*  BUN 28*  CREATININE 1.18  CALCIUM 9.7   LFT Recent Labs    09/30/21 1519  PROT 8.0  ALBUMIN 4.5  AST 49*  ALT 48*  ALKPHOS 48  BILITOT 0.9      Klynn Linnemann L. Tarri Glenn, MD, MPH 09/30/2021, 5:18 PM

## 2021-09-30 NOTE — ED Triage Notes (Signed)
PT states he was eating a bratwurst Friday and believes it is lodged in his throat. Airway is patent, NAD, and pt is speaking in full sentences.

## 2021-09-30 NOTE — Discharge Instructions (Signed)
YOU HAD AN ENDOSCOPIC PROCEDURE TODAY: Refer to the procedure report and other information in the discharge instructions given to you for any specific questions about what was found during the examination. If this information does not answer your questions, please call [facility] office at [phone] to clarify.  ° °YOU SHOULD EXPECT: Some feelings of bloating in the abdomen. Passage of more gas than usual. Walking can help get rid of the air that was put into your GI tract during the procedure and reduce the bloating. If you had a lower endoscopy (such as a colonoscopy or flexible sigmoidoscopy) you may notice spotting of blood in your stool or on the toilet paper. Some abdominal soreness may be present for a day or two, also. ° °DIET: Your first meal following the procedure should be a light meal and then it is ok to progress to your normal diet. A half-sandwich or bowl of soup is an example of a good first meal. Heavy or fried foods are harder to digest and may make you feel nauseous or bloated. Drink plenty of fluids but you should avoid alcoholic beverages for 24 hours. If you had a esophageal dilation, please see attached instructions for diet.   ° °ACTIVITY: Your care partner should take you home directly after the procedure. You should plan to take it easy, moving slowly for the rest of the day. You can resume normal activity the day after the procedure however YOU SHOULD NOT DRIVE, use power tools, machinery or perform tasks that involve climbing or major physical exertion for 24 hours (because of the sedation medicines used during the test).  ° °SYMPTOMS TO REPORT IMMEDIATELY: °A gastroenterologist can be reached at any hour. Please call [phone]  for any of the following symptoms:  °Following lower endoscopy (colonoscopy, flexible sigmoidoscopy) °Excessive amounts of blood in the stool  °Significant tenderness, worsening of abdominal pains  °Swelling of the abdomen that is new, acute  °Fever of 100° or  higher  °Following upper endoscopy (EGD, EUS, ERCP, esophageal dilation) °Vomiting of blood or coffee ground material  °New, significant abdominal pain  °New, significant chest pain or pain under the shoulder blades  °Painful or persistently difficult swallowing  °New shortness of breath  °Black, tarry-looking or red, bloody stools ° °FOLLOW UP:  °If any biopsies were taken you will be contacted by phone or by letter within the next 1-3 weeks. Call [phone]  if you have not heard about the biopsies in 3 weeks.  °Please also call with any specific questions about appointments or follow up tests. ° °

## 2021-09-30 NOTE — Transfer of Care (Signed)
Immediate Anesthesia Transfer of Care Note  Patient: Mark Booth  Procedure(s) Performed: ESOPHAGOGASTRODUODENOSCOPY (EGD) WITH PROPOFOL BIOPSY  Patient Location: PACU  Anesthesia Type:General  Level of Consciousness: awake, alert  and oriented  Airway & Oxygen Therapy: Patient Spontanous Breathing and Patient connected to face mask oxygen  Post-op Assessment: Report given to RN and Post -op Vital signs reviewed and stable  Post vital signs: Reviewed and stable  Last Vitals:  Vitals Value Taken Time  BP 124/81 09/30/21 1812  Temp    Pulse 90 09/30/21 1814  Resp 17 09/30/21 1814  SpO2 100 % 09/30/21 1814  Vitals shown include unvalidated device data.  Last Pain:  Vitals:   09/30/21 1731  TempSrc: Temporal  PainSc: 0-No pain         Complications: No notable events documented.

## 2021-09-30 NOTE — ED Notes (Signed)
Pt went to GI procedure

## 2021-09-30 NOTE — Anesthesia Preprocedure Evaluation (Signed)
Anesthesia Evaluation  Patient identified by MRN, date of birth, ID band Patient awake    Reviewed: Allergy & Precautions, NPO status , Patient's Chart, lab work & pertinent test results, reviewed documented beta blocker date and time   History of Anesthesia Complications Negative for: history of anesthetic complications  Airway Mallampati: II  TM Distance: >3 FB Neck ROM: Full    Dental  (+) Poor Dentition   Pulmonary asthma ,    Pulmonary exam normal        Cardiovascular hypertension, Pt. on medications and Pt. on home beta blockers Normal cardiovascular exam     Neuro/Psych Anxiety Depression Bipolar Disorder negative neurological ROS     GI/Hepatic (+)     substance abuse  alcohol use, Food impaction   Endo/Other  diabetesHypothyroidism   Renal/GU negative Renal ROS  negative genitourinary   Musculoskeletal  (+) Arthritis ,   Abdominal   Peds  Hematology negative hematology ROS (+)   Anesthesia Other Findings Day of surgery medications reviewed with patient.  Reproductive/Obstetrics negative OB ROS                            Anesthesia Physical Anesthesia Plan  ASA: 2 and emergent  Anesthesia Plan: General   Post-op Pain Management: Minimal or no pain anticipated   Induction: Intravenous, Cricoid pressure planned and Rapid sequence  PONV Risk Score and Plan: 2 and Treatment may vary due to age or medical condition, Midazolam, Ondansetron and Dexamethasone  Airway Management Planned: Oral ETT  Additional Equipment: None  Intra-op Plan:   Post-operative Plan: Extubation in OR  Informed Consent: I have reviewed the patients History and Physical, chart, labs and discussed the procedure including the risks, benefits and alternatives for the proposed anesthesia with the patient or authorized representative who has indicated his/her understanding and acceptance.      Dental advisory given  Plan Discussed with: CRNA  Anesthesia Plan Comments:        Anesthesia Quick Evaluation

## 2021-10-01 ENCOUNTER — Encounter (HOSPITAL_COMMUNITY): Payer: Self-pay | Admitting: Gastroenterology

## 2021-10-02 NOTE — Anesthesia Postprocedure Evaluation (Signed)
Anesthesia Post Note  Patient: Theatre manager  Procedure(s) Performed: ESOPHAGOGASTRODUODENOSCOPY (EGD) WITH PROPOFOL BIOPSY     Anesthesia Post Evaluation No notable events documented.  Last Vitals:  Vitals:   09/30/21 1830 09/30/21 1834  BP: 122/77 123/75  Pulse: 88 84  Resp: 12 14  Temp:    SpO2: 94% 94%    Last Pain:  Vitals:   09/30/21 1834  TempSrc:   PainSc: 0-No pain                 Marthenia Rolling

## 2021-10-03 ENCOUNTER — Encounter: Payer: Self-pay | Admitting: Gastroenterology

## 2021-10-03 LAB — SURGICAL PATHOLOGY

## 2021-10-09 ENCOUNTER — Encounter: Payer: Self-pay | Admitting: Internal Medicine

## 2021-10-09 ENCOUNTER — Ambulatory Visit (INDEPENDENT_AMBULATORY_CARE_PROVIDER_SITE_OTHER): Payer: Medicare Other | Admitting: Internal Medicine

## 2021-10-09 VITALS — BP 120/72 | HR 76 | Ht 72.0 in

## 2021-10-09 DIAGNOSIS — R1319 Other dysphagia: Secondary | ICD-10-CM

## 2021-10-09 DIAGNOSIS — K222 Esophageal obstruction: Secondary | ICD-10-CM | POA: Diagnosis not present

## 2021-10-09 DIAGNOSIS — K2100 Gastro-esophageal reflux disease with esophagitis, without bleeding: Secondary | ICD-10-CM

## 2021-10-09 MED ORDER — PANTOPRAZOLE SODIUM 40 MG PO TBEC
40.0000 mg | DELAYED_RELEASE_TABLET | Freq: Every morning | ORAL | 3 refills | Status: DC
Start: 2021-10-09 — End: 2021-10-24

## 2021-10-09 NOTE — Patient Instructions (Signed)
We have sent the following medications to your pharmacy for you to pick up at your convenience: pantoprazole.   You have been scheduled for an endoscopy. Please follow written instructions given to you at your visit today. If you use inhalers (even only as needed), please bring them with you on the day of your procedure.  The [place] [facility] providers would like to encourage you to use [facility] to communicate with providers for non-urgent requests or questions.  Due to long hold times on the telephone, sending your provider a message by [facility] may be a faster and more efficient way to get a response.  Please allow 48 business hours for a response.  Please remember that this is for non-urgent requests.   Due to recent changes in healthcare laws, you may see the results of your imaging and laboratory studies on MyChart before your provider has had a chance to review them.  We understand that in some cases there may be results that are confusing or concerning to you. Not all laboratory results come back in the same time frame and the provider may be waiting for multiple results in order to interpret others.  Please give [place] 48 hours in order for your provider to thoroughly review all the results before contacting the office for clarification of your results.

## 2021-10-09 NOTE — Progress Notes (Signed)
HISTORY OF PRESENT ILLNESS:  Mark Booth is a 61 y.o. male, disabled from bipolar disorder, with past medical history as listed below, who has been seen in this office previously for alcohol related liver test abnormalities, alcohol and obesity related fatty liver by imaging, and adenomatous colon polyps for which he undergoes regular surveillance.  He was last seen in this office Jun 19, 2021.  See that dictation for details.  He does have immunity to hepatitis B.  He was told to abstain from all alcohol and follow-up in 1 year.  Patient tells me that he scheduled an appointment to see me regarding problems with significant heartburn and indigestion.  As well he is experiencing some dysphagia.  He was taking famotidine, which was ineffective.  He subsequently presented to the hospital emergency room September 30, 2021 with acute food impaction after consuming bratwurst.  He underwent urgent upper endoscopy that same day with Dr. Thornton Park.  The food impaction had spontaneously passed into the stomach.  He was noted to have erosive esophagitis.  Also superficial duodenal ulcers.  Testing for Helicobacter pylori was negative.  He was placed on pantoprazole 40 mg daily.  This follow-up appointment made..  Patient tells me that his problems with indigestion and heartburn have completely resolved on pantoprazole.  He still has some intermittent dysphagia.  No appreciable medication side effects.  He is pleased.  His last surveillance colonoscopy was performed October 2020.  Follow-up in 5 years recommended.  REVIEW OF SYSTEMS:  All non-GI ROS negative unless otherwise stated in the HPI except for headaches, sleeping problems, urinary frequency  Past Medical History:  Diagnosis Date   Allergic rhinitis    Allergy    AS A CHILD   Asthma    Bipolar disorder ([place])    Cataract    BILATERAL-REMOVED   Depression    Hyperlipidemia    Hypertension    Hypothyroidism    Osteoarthritis     Overweight(278.02)    Unspecified vitamin D deficiency     Past Surgical History:  Procedure Laterality Date   APPENDECTOMY     BIOPSY  09/30/2021   Procedure: BIOPSY;  Surgeon: Thornton Park, MD;  Location: [facility];  Service: Gastroenterology;;   CATARACT EXTRACTION  12-2012   BILATERAL   COLONOSCOPY     ESOPHAGOGASTRODUODENOSCOPY (EGD) WITH PROPOFOL N/A 09/30/2021   Procedure: ESOPHAGOGASTRODUODENOSCOPY (EGD) WITH PROPOFOL;  Surgeon: Thornton Park, MD;  Location: [facility];  Service: Gastroenterology;  Laterality: N/A;   POLYPECTOMY     SHOULDER SURGERY  2013    Social History Mark Booth  reports that he has never smoked. He has never used smokeless tobacco. He reports current alcohol use of about 42.0 standard drinks of alcohol per week. He reports that he does not use drugs.  family history includes Colon cancer in his paternal grandfather; Depression in his mother; Diabetes in his mother; Hypothyroidism in his mother; Osteoporosis in his mother; Pancreatic cancer in his mother; Prostate cancer in his father.  No Known Allergies     PHYSICAL EXAMINATION: Vital signs: BP 120/72   Pulse 76   Ht 6' (1.829 m)   BMI 30.92 kg/m   Constitutional: generally well-appearing, no acute distress Psychiatric: alert and oriented x3, cooperative Eyes: extraocular movements intact, anicteric, conjunctiva pink Mouth: oral pharynx moist, no lesions Neck: supple no lymphadenopathy Cardiovascular: heart regular rate and rhythm, no murmur Lungs: clear to auscultation bilaterally Abdomen: soft, nontender, nondistended, no obvious ascites, no peritoneal signs, normal bowel sounds,  no organomegaly Rectal: Omitted Extremities: no, cyanosis, or lower extremity edema bilaterally Skin: no lesions on visible extremities Neuro: No focal deficits.  Cranial nerves intact.  No asterixis  ASSESSMENT:  1.  GERD with erosive esophagitis complicated by peptic stricture and recent food  impaction. 2.  Fatty liver disease 3.  Obesity 4.  History of alcohol abuse 5.  Metabolic syndrome 6.  History of adenomatous polyps   PLAN:  1.  Reflux precautions 2.  Continue pantoprazole 40 mg daily.  Prescription refilled.  Medication risk reviewed 3.  Schedule upper endoscopy with probable esophageal dilation.The nature of the procedure, as well as the risks, benefits, and alternatives were carefully and thoroughly reviewed with the patient. Ample time for discussion and questions allowed. The patient understood, was satisfied, and agreed to proceed.  4.  Avoid alcohol 5.  Weight loss 6.  Surveillance colonoscopy around October 2025 A total time of 35 minutes was spent preparing to see the patient, reviewing a myriad of records, obtaining history, performing medically appropriate physical examination, counseling and educating the patient regarding the above listed issues, ordering medication, ordering advanced therapeutic endoscopic procedure, and documenting clinical information in the health record

## 2021-10-24 ENCOUNTER — Ambulatory Visit (AMBULATORY_SURGERY_CENTER): Payer: Medicare Other | Admitting: Internal Medicine

## 2021-10-24 ENCOUNTER — Encounter: Payer: Self-pay | Admitting: Internal Medicine

## 2021-10-24 ENCOUNTER — Telehealth: Payer: Self-pay | Admitting: *Deleted

## 2021-10-24 VITALS — BP 123/75 | HR 83 | Temp 97.5°F | Resp 15 | Ht 72.0 in | Wt 236.4 lb

## 2021-10-24 DIAGNOSIS — R1319 Other dysphagia: Secondary | ICD-10-CM | POA: Diagnosis not present

## 2021-10-24 DIAGNOSIS — K2100 Gastro-esophageal reflux disease with esophagitis, without bleeding: Secondary | ICD-10-CM | POA: Diagnosis not present

## 2021-10-24 DIAGNOSIS — K222 Esophageal obstruction: Secondary | ICD-10-CM

## 2021-10-24 MED ORDER — PANTOPRAZOLE SODIUM 40 MG PO TBEC: 40.0000 mg | DELAYED_RELEASE_TABLET | Freq: Two times a day (BID) | ORAL | 11 refills | Status: DC

## 2021-10-24 MED ORDER — SODIUM CHLORIDE 0.9 % IV SOLN: 500.0000 mL | Freq: Once | INTRAVENOUS | Status: DC

## 2021-10-24 NOTE — Progress Notes (Signed)
Called to room to assist during endoscopic procedure.  Patient ID and intended procedure confirmed with present staff. Received instructions for my participation in the procedure from the performing physician.  

## 2021-10-24 NOTE — Progress Notes (Signed)
Pt's states no medical or surgical changes since previsit or office visit. 

## 2021-10-24 NOTE — Progress Notes (Signed)
HISTORY OF PRESENT ILLNESS:   Mark Booth is a 61 y.o. male, disabled from bipolar disorder, with past medical history as listed below, who has been seen in this office previously for alcohol related liver test abnormalities, alcohol and obesity related fatty liver by imaging, and adenomatous colon polyps for which he undergoes regular surveillance.  He was last seen in this office Jun 19, 2021.  See that dictation for details.  He does have immunity to hepatitis B.  He was told to abstain from all alcohol and follow-up in 1 year.   Patient tells me that he scheduled an appointment to see me regarding problems with significant heartburn and indigestion.  As well he is experiencing some dysphagia.  He was taking famotidine, which was ineffective.  He subsequently presented to the hospital emergency room September 30, 2021 with acute food impaction after consuming bratwurst.  He underwent urgent upper endoscopy that same day with Dr. Thornton Park.  The food impaction had spontaneously passed into the stomach.  He was noted to have erosive esophagitis.  Also superficial duodenal ulcers.  Testing for Helicobacter pylori was negative.  He was placed on pantoprazole 40 mg daily.  This follow-up appointment made..   Patient tells me that his problems with indigestion and heartburn have completely resolved on pantoprazole.  He still has some intermittent dysphagia.  No appreciable medication side effects.  He is pleased.  His last surveillance colonoscopy was performed October 2020.  Follow-up in 5 years recommended.   REVIEW OF SYSTEMS:   All non-GI ROS negative unless otherwise stated in the HPI except for headaches, sleeping problems, urinary frequency       Past Medical History:  Diagnosis Date   Allergic rhinitis     Allergy      AS A CHILD   Asthma     Bipolar disorder ([place])     Cataract      BILATERAL-REMOVED   Depression     Hyperlipidemia     Hypertension     Hypothyroidism      Osteoarthritis     Overweight(278.02)     Unspecified vitamin D deficiency             Past Surgical History:  Procedure Laterality Date   APPENDECTOMY       BIOPSY   09/30/2021    Procedure: BIOPSY;  Surgeon: Thornton Park, MD;  Location: [facility];  Service: Gastroenterology;;   CATARACT EXTRACTION   12-2012    BILATERAL   COLONOSCOPY       ESOPHAGOGASTRODUODENOSCOPY (EGD) WITH PROPOFOL N/A 09/30/2021    Procedure: ESOPHAGOGASTRODUODENOSCOPY (EGD) WITH PROPOFOL;  Surgeon: Thornton Park, MD;  Location: [facility];  Service: Gastroenterology;  Laterality: N/A;   POLYPECTOMY       SHOULDER SURGERY   2013      Social History Mark Booth  reports that he has never smoked. He has never used smokeless tobacco. He reports current alcohol use of about 42.0 standard drinks of alcohol per week. He reports that he does not use drugs.   family history includes Colon cancer in his paternal grandfather; Depression in his mother; Diabetes in his mother; Hypothyroidism in his mother; Osteoporosis in his mother; Pancreatic cancer in his mother; Prostate cancer in his father.   No Known Allergies       PHYSICAL EXAMINATION: Vital signs: BP 120/72   Pulse 76   Ht 6' (1.829 m)   BMI 30.92 kg/m   Constitutional: generally well-appearing, no acute distress  Psychiatric: alert and oriented x3, cooperative Eyes: extraocular movements intact, anicteric, conjunctiva pink Mouth: oral pharynx moist, no lesions Neck: supple no lymphadenopathy Cardiovascular: heart regular rate and rhythm, no murmur Lungs: clear to auscultation bilaterally Abdomen: soft, nontender, nondistended, no obvious ascites, no peritoneal signs, normal bowel sounds, no organomegaly Rectal: Omitted Extremities: no, cyanosis, or lower extremity edema bilaterally Skin: no lesions on visible extremities Neuro: No focal deficits.  Cranial nerves intact.  No asterixis   ASSESSMENT:   1.  GERD with erosive  esophagitis complicated by peptic stricture and recent food impaction. 2.  Fatty liver disease 3.  Obesity 4.  History of alcohol abuse 5.  Metabolic syndrome 6.  History of adenomatous polyps     PLAN:   1.  Reflux precautions 2.  Continue pantoprazole 40 mg daily.  Prescription refilled.  Medication risk reviewed 3.  Schedule upper endoscopy with probable esophageal dilation.The nature of the procedure, as well as the risks, benefits, and alternatives were carefully and thoroughly reviewed with the patient. Ample time for discussion and questions allowed. The patient understood, was satisfied, and agreed to proceed.  4.  Avoid alcohol 5.  Weight loss 6.  Surveillance colonoscopy around October 2025

## 2021-10-24 NOTE — Patient Instructions (Signed)
YOU HAD AN ENDOSCOPIC PROCEDURE TODAY AT [facility]:   Refer to the procedure report that was given to you for any specific questions about what was found during the examination.  If the procedure report does not answer your questions, please call your gastroenterologist to clarify.  If you requested that your care partner not be given the details of your procedure findings, then the procedure report has been included in a sealed envelope for you to review at your convenience later.  YOU SHOULD EXPECT: Some feelings of bloating in the abdomen. Passage of more gas than usual.  Walking can help get rid of the air that was put into your GI tract during the procedure and reduce the bloating. If you had a lower endoscopy (such as a colonoscopy or flexible sigmoidoscopy) you may notice spotting of blood in your stool or on the toilet paper. If you underwent a bowel prep for your procedure, you may not have a normal bowel movement for a few days.  Please Note:  You might notice some irritation and congestion in your nose or some drainage.  This is from the oxygen used during your procedure.  There is no need for concern and it should clear up in a day or so.  SYMPTOMS TO REPORT IMMEDIATELY:  Following upper endoscopy (EGD)  Vomiting of blood or coffee ground material  New chest pain or pain under the shoulder blades  Painful or persistently difficult swallowing  New shortness of breath  Fever of 100F or higher  Black, tarry-looking stools  For urgent or emergent issues, a gastroenterologist can be reached at any hour by calling [phone]. Do not use MyChart messaging for urgent concerns.    DIET:  Follow a Post-Dilation Diet (see handout given to you by your recovery nurse): NOTHING TO EAT OR [place] 12:00PM, then [place] from 12:00pm to 1:00pm, then you by follow a SOFT DIET for the rest of the day today beginning at 1:00pm. You may then proceed to your regular  diet as tolerated tomorrow morning.  Drink plenty of fluids but you should avoid alcoholic beverages for 24 hours.  MEDICATIONS: Increase your Pantoprazole to 40 mg by mouth TWICE DAILY. (#60 with 11 refills sent to your pharmacy).  FOLLOW UP: We will repeat the upper endoscopy with dilation in about 6 weeks and focus on proximal rings with the [facility]. This has been scheduled for Thursday, Oct. 26th at 1:30pm (arrive at 12:30pm).  Please see handouts given to you by your recovery nurse.  Thank you for allowing [place] to provide for your healthcare needs today.  ACTIVITY:  You should plan to take it easy for the rest of today and you should NOT DRIVE or use heavy machinery until tomorrow (because of the sedation medicines used during the test).    FOLLOW UP: Our staff will call the number listed on your records the next business day following your procedure.  We will call around 7:15- 8:00 am to check on you and address any questions or concerns that you may have regarding the information given to you following your procedure. If we do not reach you, we will leave a message.     If any biopsies were taken you will be contacted by phone or by letter within the next 1-3 weeks.  Please call us at [phone] if you have not heard about the biopsies in 3 weeks.    SIGNATURES/CONFIDENTIALITY: You and/or your care partner  have signed paperwork which will be entered into your electronic medical record.  These signatures attest to the fact that that the information above on your After Visit Summary has been reviewed and is understood.  Full responsibility of the confidentiality of this discharge information lies with you and/or your care-partner.

## 2021-10-24 NOTE — Progress Notes (Signed)
To pacu, VSS. Report to [email or web address] 

## 2021-10-24 NOTE — Telephone Encounter (Signed)
Instructions for repeat EGD for Dysphagia in 6 weeks reviewed with patient and wife.

## 2021-10-24 NOTE — Op Note (Signed)
[name] Procedure Date: 10/24/2021 10:57 AM MRN: [id number] Endoscopist: Docia Chuck. Henrene Pastor , MD Age: 61 Referring MD:  Date of Birth: 05/20/1960 Gender: Male Account #: [id number] Procedure:                Upper GI endoscopy with balloon dilation of the                            esophagus. 18 mm max Indications:              Therapeutic procedure, Dysphagia, Esophageal reflux Medicines:                Monitored Anesthesia Care Procedure:                Pre-Anesthesia Assessment:                           - Prior to the procedure, a History and Physical                            was performed, and patient medications and                            allergies were reviewed. The patient's tolerance of                            previous anesthesia was also reviewed. The risks                            and benefits of the procedure and the sedation                            options and risks were discussed with the patient.                            All questions were answered, and informed consent                            was obtained. Prior Anticoagulants: The patient has                            taken no previous anticoagulant or antiplatelet                            agents. ASA Grade Assessment: II - A patient with                            mild systemic disease. After reviewing the risks                            and benefits, the patient was deemed in                            satisfactory condition to undergo the procedure.  After obtaining informed consent, the endoscope was                            passed under direct vision. Throughout the                            procedure, the patient's blood pressure, pulse, and                            oxygen saturations were monitored continuously. The                            GIF [id number] #[id number] was introduced through the                            mouth,  and advanced to the second part of duodenum.                            The upper GI endoscopy was accomplished without                            difficulty. The patient tolerated the procedure                            well. Scope In: Scope Out: Findings:                 The esophagus was revealed rings throughout as well                            as subtle furrowing. Most prominent in the proximal                            portion of the mid esophagus. There was also focal                            peptic stricture at the gastroesophageal junction                            measuring approximately 14 mm. After completing the                            survey, A TTS dilator was passed through the scope.                            Dilation with an 18-19-20 mm balloon dilator was                            performed to 18 mm. Dilation was only carried out                            in the distal portion of the esophagus. There was  renting of the mucosa in the distal portion.                           The stomach was normal, save small hiatal hernia.                           The examined duodenum was normal.                           The cardia and gastric fundus were normal on                            retroflexion. Complications:            No immediate complications. Estimated Blood Loss:     Estimated blood loss: none. Impression:               1. 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 Chronic GERD                           3. Distal stricture?"dilated.                           4. Small hiatal hernia. Otherwise normal EGD Recommendation:           1. Patient has a contact number available for                            emergencies. The signs and symptoms of potential                            delayed complications were discussed with the                            patient. Return to normal activities tomorrow.                             Written discharge instructions were provided to the                            patient.                           2. Post dilation diet.                           3. Continue present medications.                           4. Increase pantoprazole to 40 mg twice daily.                            Prescribe pantoprazole 40 mg twice daily; #60; 11  refills                           5. Schedule repeat upper endoscopy with dilation in                            about 6 weeks. We will focus on proximal rings the                            [facility] dilator. Docia Chuck. Henrene Pastor, MD 10/24/2021 11:14:11 AM This report has been signed electronically.

## 2021-10-25 ENCOUNTER — Telehealth: Payer: Self-pay | Admitting: *Deleted

## 2021-10-25 NOTE — Telephone Encounter (Signed)
  Follow up Call-    Row Labels 10/24/2021   10:27 AM  Call back number   Section Header. No data exists in this row.   Post procedure Call Back phone  #   [phone]  Permission to leave phone message   Yes     Patient questions:  Do you have a fever, pain , or abdominal swelling? No. Pain Score  0 *  Have you tolerated food without any problems? Yes.    Have you been able to return to your normal activities? Yes.    Do you have any questions about your discharge instructions: Diet   No. Medications  No. Follow up visit  No.  Do you have questions or concerns about your Care? No.  Actions: * If pain score is 4 or above: No action needed, pain <4.

## 2021-11-12 ENCOUNTER — Other Ambulatory Visit: Payer: Self-pay | Admitting: Internal Medicine

## 2021-11-12 DIAGNOSIS — E039 Hypothyroidism, unspecified: Secondary | ICD-10-CM

## 2021-11-15 ENCOUNTER — Telehealth: Payer: Self-pay | Admitting: Licensed Clinical Social Worker

## 2021-11-15 NOTE — Patient Outreach (Signed)
  Care Coordination   11/15/2021 Name: Ziquan Fidel MRN: [id number] DOB: 24-May-1960   Care Coordination Outreach Attempts:  An unsuccessful telephone outreach was attempted today to offer the patient information about available care coordination services as a benefit of their health plan.   Follow Up Plan:  Additional outreach attempts will be made to offer the patient care coordination information and services.   Encounter Outcome:  No Answer  Care Coordination Interventions Activated:  No   Care Coordination Interventions:  No, not indicated    Casimer Lanius, [place] [phone]

## 2021-11-26 ENCOUNTER — Other Ambulatory Visit: Payer: Self-pay | Admitting: Internal Medicine

## 2021-11-28 ENCOUNTER — Other Ambulatory Visit: Payer: Self-pay | Admitting: Internal Medicine

## 2021-12-05 ENCOUNTER — Encounter: Payer: Self-pay | Admitting: Internal Medicine

## 2021-12-05 ENCOUNTER — Ambulatory Visit (INDEPENDENT_AMBULATORY_CARE_PROVIDER_SITE_OTHER): Payer: Medicare Other | Admitting: Internal Medicine

## 2021-12-05 VITALS — BP 120/60 | HR 91 | Temp 97.8°F | Ht 73.0 in | Wt 236.0 lb

## 2021-12-05 DIAGNOSIS — F1010 Alcohol abuse, uncomplicated: Secondary | ICD-10-CM

## 2021-12-05 DIAGNOSIS — E559 Vitamin D deficiency, unspecified: Secondary | ICD-10-CM | POA: Diagnosis not present

## 2021-12-05 DIAGNOSIS — Z23 Encounter for immunization: Secondary | ICD-10-CM | POA: Diagnosis not present

## 2021-12-05 DIAGNOSIS — K760 Fatty (change of) liver, not elsewhere classified: Secondary | ICD-10-CM

## 2021-12-05 DIAGNOSIS — I10 Essential (primary) hypertension: Secondary | ICD-10-CM | POA: Diagnosis not present

## 2021-12-05 LAB — COMPREHENSIVE METABOLIC PANEL
ALT: 49 U/L (ref 0–53)
AST: 37 U/L (ref 0–37)
Albumin: 4.3 g/dL (ref 3.5–5.2)
Alkaline Phosphatase: 44 U/L (ref 39–117)
BUN: 16 mg/dL (ref 6–23)
CO2: 28 mEq/L (ref 19–32)
Calcium: 9.8 mg/dL (ref 8.4–10.5)
Chloride: 102 mEq/L (ref 96–112)
Creatinine, Ser: 1 mg/dL (ref 0.40–1.50)
GFR: 82.3 mL/min (ref >60.00)
Glucose, Bld: 173 mg/dL — ABNORMAL HIGH (ref 70–99)
Potassium: 4.1 mEq/L (ref 3.5–5.1)
Sodium: 136 mEq/L (ref 135–145)
Total Bilirubin: 0.3 mg/dL (ref 0.2–1.2)
Total Protein: 7.3 g/dL (ref 6.0–8.3)

## 2021-12-05 LAB — VITAMIN D 25 HYDROXY (VIT D DEFICIENCY, FRACTURES): VITD: 30.93 ng/mL (ref 30.00–100.00)

## 2021-12-05 NOTE — Progress Notes (Signed)
   Subjective:   Patient ID: Mark Booth, male    DOB: 12/21/1960, 61 y.o.   MRN: [id number]  HPI The patient is a 61 YO man coming in for follow up.  Review of Systems  Constitutional: Negative.   HENT: Negative.    Eyes: Negative.   Respiratory:  Negative for cough, chest tightness and shortness of breath.   Cardiovascular:  Negative for chest pain, palpitations and leg swelling.  Gastrointestinal:  Negative for abdominal distention, abdominal pain, constipation, diarrhea, nausea and vomiting.  Musculoskeletal: Negative.   Skin: Negative.   Neurological: Negative.   Psychiatric/Behavioral: Negative.      Objective:  Physical Exam Constitutional:      Appearance: He is well-developed. He is obese.  HENT:     Head: Normocephalic and atraumatic.  Cardiovascular:     Rate and Rhythm: Normal rate and regular rhythm.  Pulmonary:     Effort: Pulmonary effort is normal. No respiratory distress.     Breath sounds: Normal breath sounds. No wheezing or rales.  Abdominal:     General: Bowel sounds are normal. There is no distension.     Palpations: Abdomen is soft.     Tenderness: There is no abdominal tenderness. There is no rebound.  Musculoskeletal:     Cervical back: Normal range of motion.  Skin:    General: Skin is warm and dry.  Neurological:     Mental Status: He is alert and oriented to person, place, and time.     Coordination: Coordination normal.     Vitals:   12/05/21 1102  BP: 120/60  Pulse: 91  Temp: 97.8 F (36.6 C)  TempSrc: Oral  SpO2: 92%  Weight: 236 lb (107 kg)  Height: '6\' 1"'$  (1.854 m)    Assessment & Plan:  Flu shot given at visit

## 2021-12-05 NOTE — Patient Instructions (Signed)
We will recheck the labs today. 

## 2021-12-06 ENCOUNTER — Ambulatory Visit (AMBULATORY_SURGERY_CENTER): Payer: Medicare Other | Admitting: Internal Medicine

## 2021-12-06 ENCOUNTER — Encounter: Payer: Self-pay | Admitting: Internal Medicine

## 2021-12-06 VITALS — BP 122/71 | HR 79 | Temp 98.6°F | Resp 13 | Ht 72.0 in | Wt 238.6 lb

## 2021-12-06 DIAGNOSIS — K2100 Gastro-esophageal reflux disease with esophagitis, without bleeding: Secondary | ICD-10-CM

## 2021-12-06 DIAGNOSIS — K2289 Other specified disease of esophagus: Secondary | ICD-10-CM | POA: Diagnosis not present

## 2021-12-06 DIAGNOSIS — K222 Esophageal obstruction: Secondary | ICD-10-CM | POA: Diagnosis not present

## 2021-12-06 DIAGNOSIS — R1310 Dysphagia, unspecified: Secondary | ICD-10-CM

## 2021-12-06 MED ORDER — SODIUM CHLORIDE 0.9 % IV SOLN: 500.0000 mL | Freq: Once | INTRAVENOUS | Status: DC

## 2021-12-06 NOTE — Progress Notes (Signed)
Report to PACU, RN, vss, BBS= Clear.  

## 2021-12-06 NOTE — Progress Notes (Signed)
Called to room to assist during endoscopic procedure.  Patient ID and intended procedure confirmed with present staff. Received instructions for my participation in the procedure from the performing physician.  

## 2021-12-06 NOTE — Progress Notes (Signed)
HISTORY OF PRESENT ILLNESS:  Mark Booth is a 61 y.o. male underwent upper endoscopy with esophageal dilation October 24, 2021.  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  Distal stricture dilated.  On twice daily PPI.  Now back for follow-up endoscopy with biopsies and dilation.  REVIEW OF SYSTEMS:  All non-GI ROS negative. Past Medical History:  Diagnosis Date   Allergic rhinitis    Allergy    AS A CHILD   Asthma    Bipolar disorder ([place])    Cataract    BILATERAL-REMOVED   Depression    Diabetes mellitus without complication (HCC)    GERD (gastroesophageal reflux disease)    Hyperlipidemia    Hypertension    Hypothyroidism    Osteoarthritis    Overweight(278.02)    Unspecified vitamin D deficiency     Past Surgical History:  Procedure Laterality Date   APPENDECTOMY     BIOPSY  09/30/2021   Procedure: BIOPSY;  Surgeon: Thornton Park, MD;  Location: [facility];  Service: Gastroenterology;;   CATARACT EXTRACTION  12-2012   BILATERAL   COLONOSCOPY     ESOPHAGOGASTRODUODENOSCOPY (EGD) WITH PROPOFOL N/A 09/30/2021   Procedure: ESOPHAGOGASTRODUODENOSCOPY (EGD) WITH PROPOFOL;  Surgeon: Thornton Park, MD;  Location: [facility];  Service: Gastroenterology;  Laterality: N/A;   POLYPECTOMY     SHOULDER SURGERY  2013    Social History Mark Booth  reports that he has never smoked. He has never used smokeless tobacco. He reports current alcohol use of about 42.0 standard drinks of alcohol per week. He reports that he does not use drugs.  family history includes Colon cancer in his paternal grandfather; Depression in his mother; Diabetes in his mother; Hypothyroidism in his mother; Osteoporosis in his mother; Pancreatic cancer in his mother; Prostate cancer in his father.  No Known Allergies     PHYSICAL EXAMINATION: Vital signs: BP 134/75   Pulse 85   Temp 98.6 F (37 C)   Ht 6' (1.829 m)   Wt 238 lb 9.6 oz (108.2 kg)   SpO2 98%   BMI  32.36 kg/m  General: Well-developed, well-nourished, no acute distress HEENT: Sclerae are anicteric, conjunctiva pink. Oral mucosa intact Lungs: Clear Heart: Regular Abdomen: soft, nontender, nondistended, no obvious ascites, no peritoneal signs, normal bowel sounds. No organomegaly. Extremities: No edema Psychiatric: alert and oriented x3. Cooperative      ASSESSMENT:  GERD with EOE esophagus/peptic stricture   PLAN:   EGD with dilation and biopsies

## 2021-12-06 NOTE — Patient Instructions (Signed)
Continue present medications, including Pantoprazole '40mg'$  twice daily.  Await pathology results.    Office follow-up with Dr. Henrene Pastor in 3 months.  Handout on post esophageal dilation diet w/ instructions, esophagitis and stricture, and hiatal hernia provided.  YOU HAD AN ENDOSCOPIC PROCEDURE TODAY AT [facility]:   Refer to the procedure report that was given to you for any specific questions about what was found during the examination.  If the procedure report does not answer your questions, please call your gastroenterologist to clarify.  If you requested that your care partner not be given the details of your procedure findings, then the procedure report has been included in a sealed envelope for you to review at your convenience later.  YOU SHOULD EXPECT: Some feelings of bloating in the abdomen. Passage of more gas than usual.  Walking can help get rid of the air that was put into your GI tract during the procedure and reduce the bloating. If you had a lower endoscopy (such as a colonoscopy or flexible sigmoidoscopy) you may notice spotting of blood in your stool or on the toilet paper. If you underwent a bowel prep for your procedure, you may not have a normal bowel movement for a few days.  Please Note:  You might notice some irritation and congestion in your nose or some drainage.  This is from the oxygen used during your procedure.  There is no need for concern and it should clear up in a day or so.  SYMPTOMS TO REPORT IMMEDIATELY: Following upper endoscopy (EGD)  Vomiting of blood or coffee ground material  New chest pain or pain under the shoulder blades  Painful or persistently difficult swallowing  New shortness of breath  Fever of 100F or higher  Black, tarry-looking stools  For urgent or emergent issues, a gastroenterologist can be reached at any hour by calling [phone]. Do not use MyChart messaging for urgent concerns.    DIET:  We do recommend a  post dilation diet:  Nothing by mouth until 2:40pm, clear liquids from 2:40-3:40pm and then soft diet the rest of today.  You may return to your prior diet tomorrow.  Drink plenty of fluids but you should avoid alcoholic beverages for 24 hours.  ACTIVITY:  You should plan to take it easy for the rest of today and you should NOT DRIVE or use heavy machinery until tomorrow (because of the sedation medicines used during the test).    FOLLOW UP: Our staff will call the number listed on your records the next business day following your procedure.  We will call around 7:15- 8:00 am to check on you and address any questions or concerns that you may have regarding the information given to you following your procedure. If we do not reach you, we will leave a message.     If any biopsies were taken you will be contacted by phone or by letter within the next 1-3 weeks.  Please call us at [phone] if you have not heard about the biopsies in 3 weeks.    SIGNATURES/CONFIDENTIALITY: You and/or your care partner have signed paperwork which will be entered into your electronic medical record.  These signatures attest to the fact that that the information above on your After Visit Summary has been reviewed and is understood.  Full responsibility of the confidentiality of this discharge information lies with you and/or your care-partner.

## 2021-12-06 NOTE — Op Note (Signed)
[place] Patient Name: Mark Booth Procedure Date: 12/06/2021 1:24 PM MRN: [id number] Endoscopist: Docia Chuck. Henrene Pastor , MD, [id number] Age: 61 Referring MD:  Date of Birth: 1960/05/03 Gender: Male Account #: [id number] Procedure:                Upper GI endoscopy with biopsies; Maloney dilation                            of the esophagus 48, 47F Indications:              Dysphagia, Therapeutic procedure, Esophageal reflux Medicines:                Monitored Anesthesia Care Procedure:                Pre-Anesthesia Assessment:                           - Prior to the procedure, a History and Physical                            was performed, and patient medications and                            allergies were reviewed. The patient's tolerance of                            previous anesthesia was also reviewed. The risks                            and benefits of the procedure and the sedation                            options and risks were discussed with the patient.                            All questions were answered, and informed consent                            was obtained. Prior Anticoagulants: The patient has                            taken no anticoagulant or antiplatelet agents. ASA                            Grade Assessment: II - A patient with mild systemic                            disease. After reviewing the risks and benefits,                            the patient was deemed in satisfactory condition to                            undergo the procedure.  After obtaining informed consent, the endoscope was                            passed under direct vision. Throughout the                            procedure, the patient's blood pressure, pulse, and                            oxygen saturations were monitored continuously. The                            GIF [id number] #[id number] was introduced through the                             mouth, and advanced to the second part of duodenum.                            The upper GI endoscopy was accomplished without                            difficulty. The patient tolerated the procedure                            well. Scope In: Scope Out: Findings:                 A few benign-appearing, intrinsic moderate stenoses                            were found in the proximal esophagus as well as at                            the gastroesophageal junction. There were mild EOE                            type changes (subtle rings and furrows). The scope                            was withdrawn after completing the endoscopic                            survey. Dilation was performed with a Maloney                            dilator with minimal resistance at 52 Fr. esophagus                            was subsequently dilated with a 50 French dilator                            without resistance the dilation site was examined  and showed mild mucosal disruption. Multiple                            biopsies were taken.                           The exam of the esophagus was otherwise normal.                           The stomach was normal. Small hiatal hernia.                           The examined duodenum was normal.                           The cardia and gastric fundus were normal on                            retroflexion. Complications:            No immediate complications. Estimated Blood Loss:     Estimated blood loss: none. Impression:               - Benign-appearing esophageal stenoses. Dilated.                            Biopsied.                           - Normal stomach.                           - Normal examined duodenum.                           - No specimens collected. Recommendation:           - Patient has a contact number available for                            emergencies. The signs and symptoms of potential                             delayed complications were discussed with the                            patient. Return to normal activities tomorrow.                            Written discharge instructions were provided to the                            patient.                           - Post dilation diet.                           - Continue present  medications, including                            pantoprazole 40 mg twice daily.                           - Await pathology results.                           - Office follow-up with Dr. Henrene Pastor in 3 months Docia Chuck. Henrene Pastor, MD 12/06/2021 1:47:49 PM This report has been signed electronically.

## 2021-12-07 ENCOUNTER — Telehealth: Payer: Self-pay | Admitting: *Deleted

## 2021-12-07 NOTE — Assessment & Plan Note (Signed)
Checking vitamin D as follow up he recently finished 3 months of 67425 units weekly vitamin D. If normal will stop

## 2021-12-07 NOTE — Telephone Encounter (Signed)
  Follow up Call-     12/06/2021    1:04 PM 10/24/2021   10:27 AM  Call back number  Post procedure Call Back phone  # [phone]  Permission to leave phone message Yes Yes     Patient questions:  Do you have a fever, pain , or abdominal swelling? No. Pain Score  0 *  Have you tolerated food without any problems? Yes.    Have you been able to return to your normal activities? Yes.    Do you have any questions about your discharge instructions: Diet   No. Medications  No. Follow up visit  No.  Do you have questions or concerns about your Care? No.  Actions: * If pain score is 4 or above: No action needed, pain <4.

## 2021-12-07 NOTE — Assessment & Plan Note (Signed)
BP at goal on lisinopril/hctz 10/12.5 mg daily. Checking CMP and adjust as needed.

## 2021-12-07 NOTE — Assessment & Plan Note (Signed)
He is not drinking as much lately but has resumed alcohol 2-3 drinks at a time but less often. We talked about need for complete cessation due to liver condition. Checking CMP.

## 2021-12-07 NOTE — Assessment & Plan Note (Signed)
Counseled about need for alcohol cessation and exercise and diet to help. He was abstinate from alcohol and is drinking again but less often than prior. Checking CMP.

## 2021-12-13 ENCOUNTER — Encounter: Payer: Self-pay | Admitting: Internal Medicine

## 2021-12-21 ENCOUNTER — Other Ambulatory Visit: Payer: Self-pay | Admitting: Internal Medicine

## 2022-01-08 ENCOUNTER — Other Ambulatory Visit: Payer: Self-pay

## 2022-01-08 MED ORDER — SIMVASTATIN 40 MG PO TABS: 40.0000 mg | ORAL_TABLET | Freq: Every day | ORAL | 1 refills | Status: DC

## 2022-02-08 ENCOUNTER — Other Ambulatory Visit: Payer: Self-pay | Admitting: Internal Medicine

## 2022-02-08 DIAGNOSIS — E039 Hypothyroidism, unspecified: Secondary | ICD-10-CM

## 2022-03-05 ENCOUNTER — Ambulatory Visit (INDEPENDENT_AMBULATORY_CARE_PROVIDER_SITE_OTHER): Payer: Medicare Other | Admitting: Internal Medicine

## 2022-03-05 ENCOUNTER — Encounter: Payer: Self-pay | Admitting: Internal Medicine

## 2022-03-05 VITALS — BP 122/70 | HR 83 | Temp 98.3°F | Ht 72.0 in | Wt 230.6 lb

## 2022-03-05 DIAGNOSIS — E1169 Type 2 diabetes mellitus with other specified complication: Secondary | ICD-10-CM

## 2022-03-05 DIAGNOSIS — R112 Nausea with vomiting, unspecified: Secondary | ICD-10-CM | POA: Diagnosis not present

## 2022-03-05 LAB — COMPREHENSIVE METABOLIC PANEL
ALT: 37 U/L (ref 0–53)
AST: 33 U/L (ref 0–37)
Albumin: 4.5 g/dL (ref 3.5–5.2)
Alkaline Phosphatase: 63 U/L (ref 39–117)
BUN: 23 mg/dL (ref 6–23)
CO2: 29 mEq/L (ref 19–32)
Calcium: 9.7 mg/dL (ref 8.4–10.5)
Chloride: 103 mEq/L (ref 96–112)
Creatinine, Ser: 1.26 mg/dL (ref 0.40–1.50)
GFR: 62.26 mL/min (ref >60.00)
Glucose, Bld: 138 mg/dL — ABNORMAL HIGH (ref 70–99)
Potassium: 4.1 mEq/L (ref 3.5–5.1)
Sodium: 140 mEq/L (ref 135–145)
Total Bilirubin: 0.7 mg/dL (ref 0.2–1.2)
Total Protein: 7.6 g/dL (ref 6.0–8.3)

## 2022-03-05 LAB — LIPASE: Lipase: 25 U/L (ref 11.0–59.0)

## 2022-03-05 LAB — CBC
HCT: 42.3 % (ref 39.0–52.0)
Hemoglobin: 14.8 g/dL (ref 13.0–17.0)
MCHC: 34.9 g/dL (ref 30.0–36.0)
MCV: 89.5 fl (ref 78.0–100.0)
Platelets: 284 10*3/uL (ref 150.0–400.0)
RBC: 4.73 Mil/uL (ref 4.22–5.81)
RDW: 13.4 % (ref 11.5–15.5)
WBC: 10.3 10*3/uL (ref 4.0–10.5)

## 2022-03-05 LAB — HEMOGLOBIN A1C: Hgb A1c MFr Bld: 8.4 % — ABNORMAL HIGH (ref 4.6–6.5)

## 2022-03-05 MED ORDER — ONDANSETRON HCL 4 MG PO TABS: 4.0000 mg | ORAL_TABLET | Freq: Three times a day (TID) | ORAL | 0 refills | Status: DC | PRN

## 2022-03-05 NOTE — Progress Notes (Signed)
   Subjective:   Patient ID: Mark Booth, male    DOB: Jun 27, 1960, 62 y.o.   MRN: [id number]  HPI The patient is a 62 YO man coming in for stomach issues nausea and vomiting with poor appetite. Going on about 1 week.  Review of Systems  Constitutional: Negative.   HENT: Negative.    Eyes: Negative.   Respiratory:  Negative for cough, chest tightness and shortness of breath.   Cardiovascular:  Negative for chest pain, palpitations and leg swelling.  Gastrointestinal:  Positive for nausea and vomiting. Negative for abdominal distention, abdominal pain, constipation and diarrhea.  Musculoskeletal: Negative.   Skin: Negative.   Neurological: Negative.   Psychiatric/Behavioral: Negative.      Objective:  Physical Exam Constitutional:      Appearance: He is well-developed.  HENT:     Head: Normocephalic and atraumatic.  Cardiovascular:     Rate and Rhythm: Normal rate and regular rhythm.  Pulmonary:     Effort: Pulmonary effort is normal. No respiratory distress.     Breath sounds: Normal breath sounds. No wheezing or rales.  Abdominal:     General: Bowel sounds are normal. There is no distension.     Palpations: Abdomen is soft.     Tenderness: There is no abdominal tenderness. There is no rebound.  Musculoskeletal:     Cervical back: Normal range of motion.  Skin:    General: Skin is warm and dry.  Neurological:     Mental Status: He is alert and oriented to person, place, and time.     Coordination: Coordination normal.     Vitals:   03/05/22 1525  BP: 122/70  Pulse: 83  Temp: 98.3 F (36.8 C)  TempSrc: Oral  SpO2: 95%  Weight: 230 lb 9.6 oz (104.6 kg)  Height: 6' (1.829 m)    Assessment & Plan:

## 2022-03-05 NOTE — Patient Instructions (Signed)
We have sent in zofran for nausea to use and will check the labs today.

## 2022-03-08 IMAGING — US US ABDOMEN LIMITED
1 series · 14 of 25 positions shown · non-contrast
Comparison: None.

CLINICAL DATA: elevated liver enzymes

EXAM:
ULTRASOUND ABDOMEN LIMITED RIGHT UPPER QUADRANT

[Series 1: us abdomen limited · 0.23mm/px · 14 of 40 slices shown]
[im 1/40]
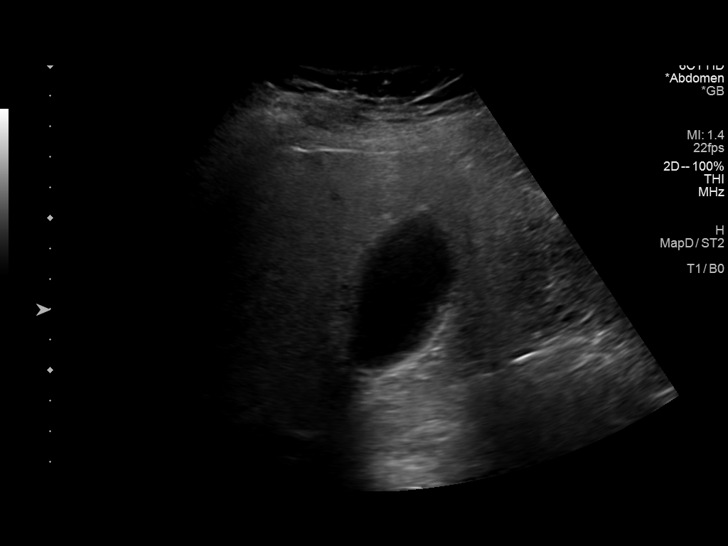
[im 4/40]
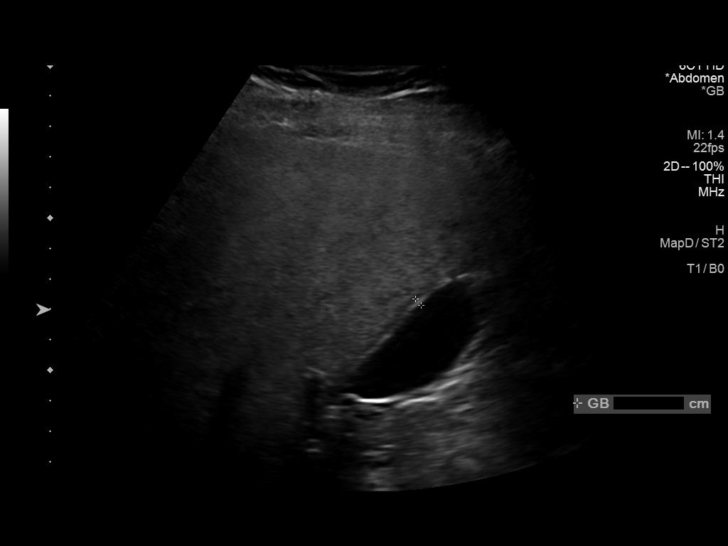
[im 7/40]
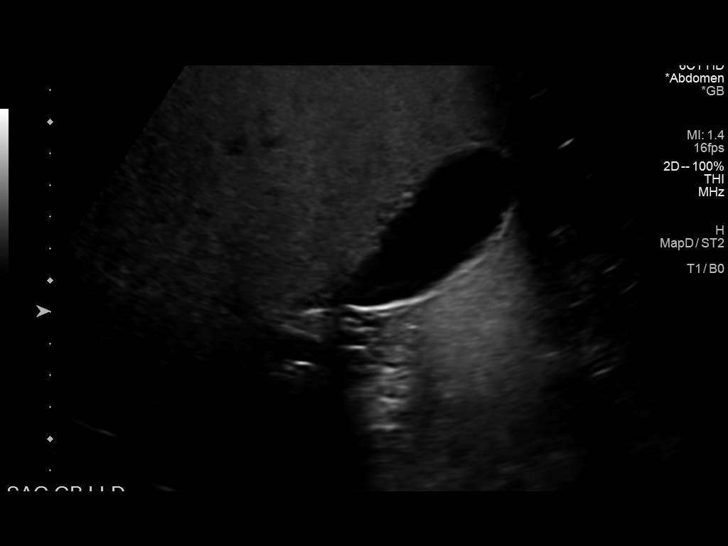
[im 10/40]
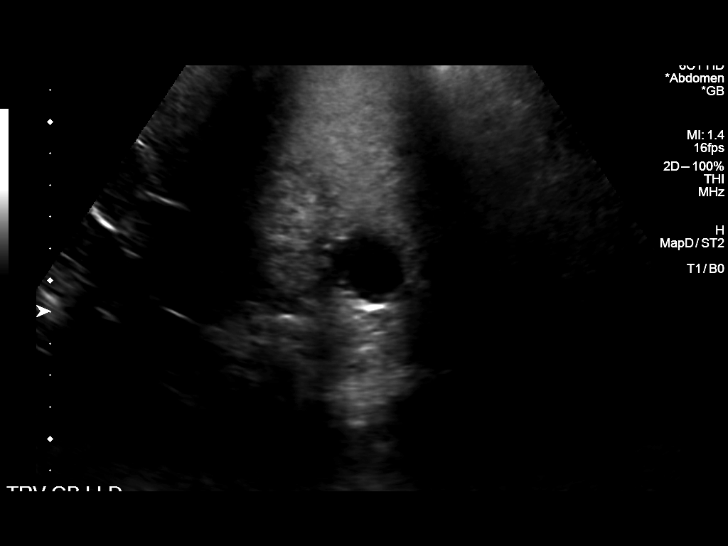
[im 14/40]
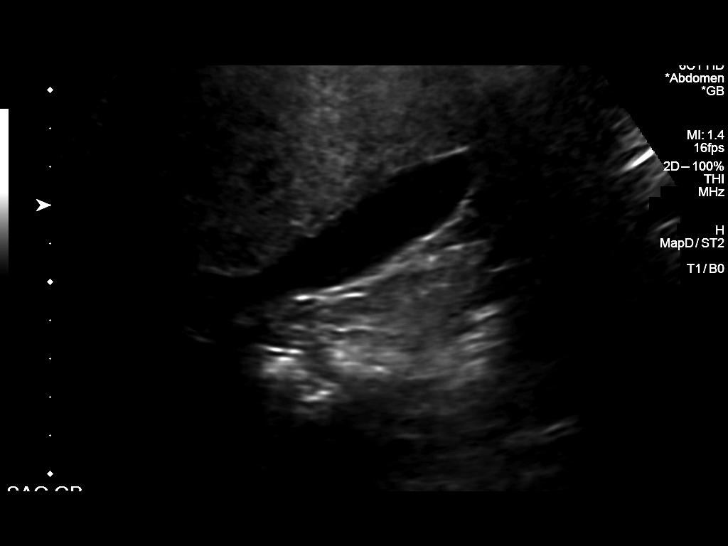
[im 15/40]
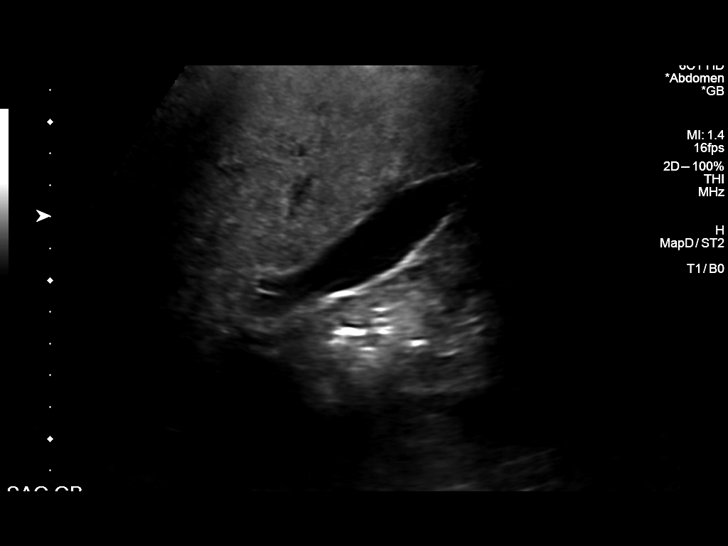
[im 18/40]
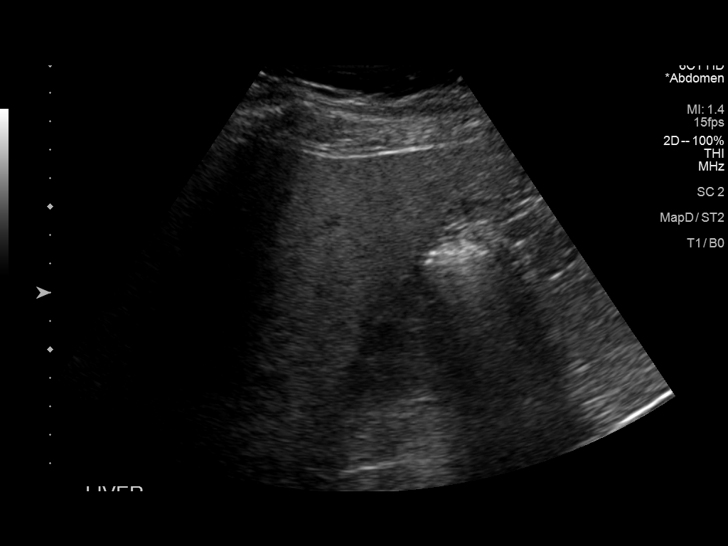
[im 22/40]
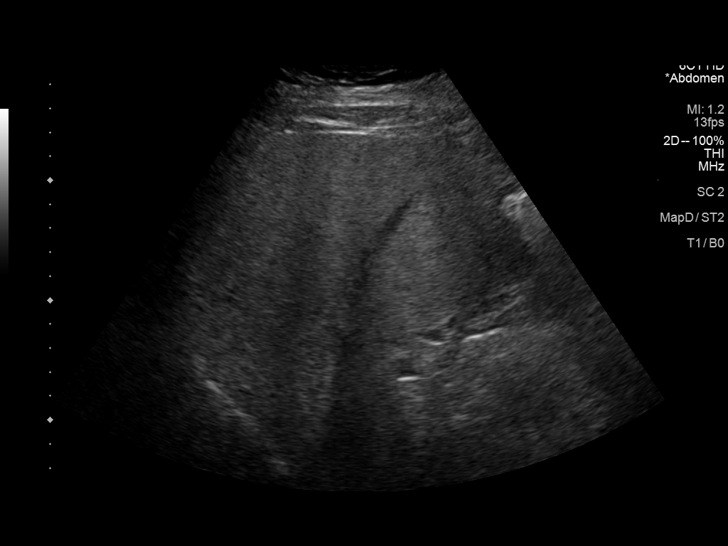
[im 25/40]
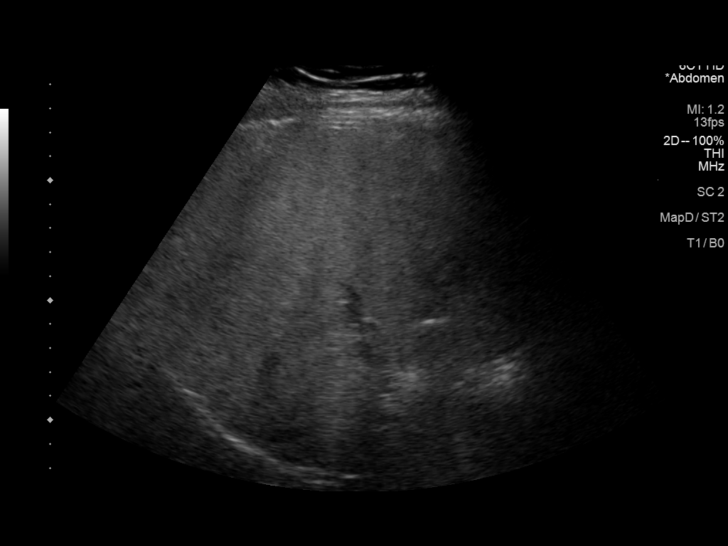
[im 27/40]
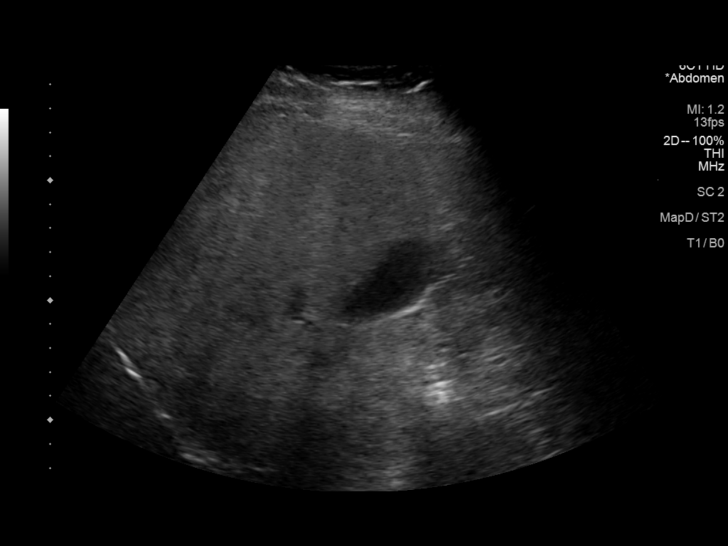
[im 30/40]
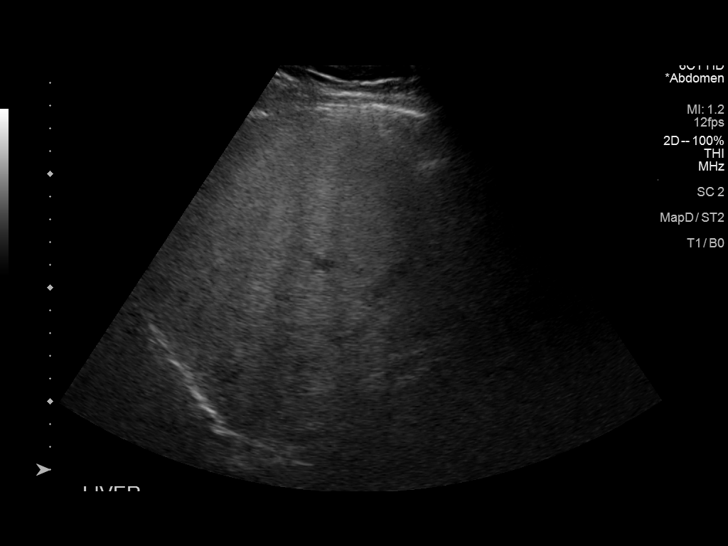
[im 33/40]
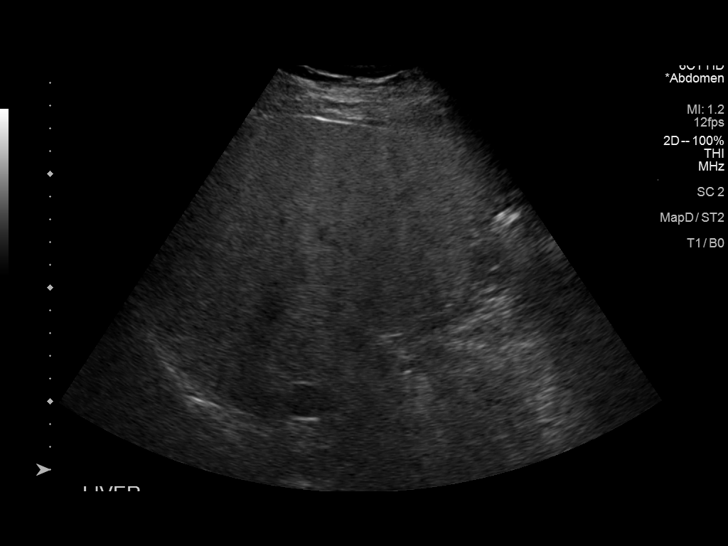
[im 36/40]
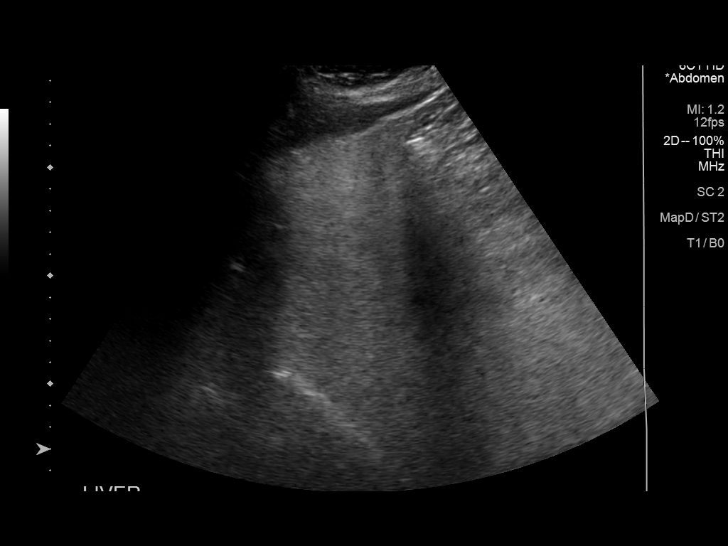
[im 40/40]
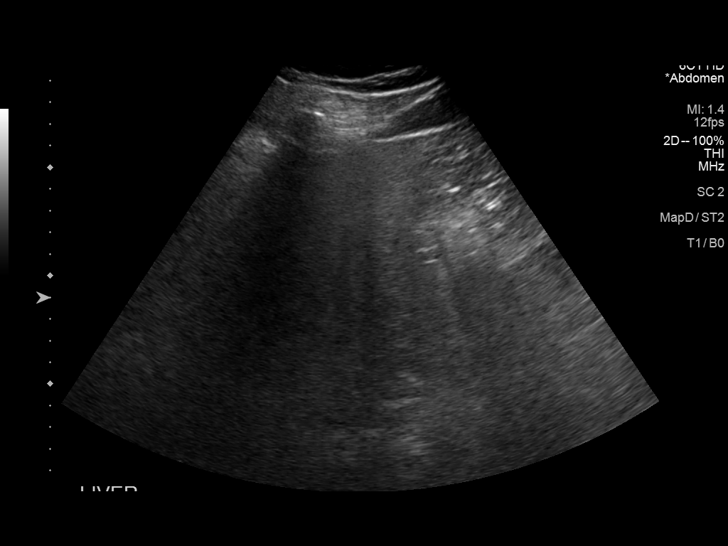

[14 of 25 positions shown; findings below may reference images not displayed]

FINDINGS: Gallbladder:

No gallstones or wall thickening visualized. No sonographic Murphy
sign noted by sonographer.

Common bile duct:

Diameter: 5.1 mm

Liver:

Diffusely increased liver echogenicity. Portal vein is patent on
color Doppler imaging with normal direction of blood flow towards
the liver.

Other: None.
IMPRESSION: Diffuse hepatic steatosis.

## 2022-03-08 MED ORDER — GLIMEPIRIDE 1 MG PO TABS: 1.0000 mg | ORAL_TABLET | Freq: Every day | ORAL | 3 refills | Status: DC

## 2022-03-08 NOTE — Assessment & Plan Note (Addendum)
Checking HgA1c as we are doing labs and due. Adjust rybelsus 14 mg daily as needed.Given that HgA1c was above goal added glimepiride 1 mg daily. Follow up 3 months.

## 2022-03-08 NOTE — Assessment & Plan Note (Signed)
Suspect viral gastroenteritis. Checking CBC, CMP, lipase to assess for alternative cause.

## 2022-03-12 ENCOUNTER — Encounter: Payer: Self-pay | Admitting: Internal Medicine

## 2022-03-12 ENCOUNTER — Ambulatory Visit (INDEPENDENT_AMBULATORY_CARE_PROVIDER_SITE_OTHER): Payer: Medicare Other | Admitting: Internal Medicine

## 2022-03-12 VITALS — BP 138/78 | HR 88 | Ht 72.0 in | Wt 231.6 lb

## 2022-03-12 DIAGNOSIS — R1319 Other dysphagia: Secondary | ICD-10-CM

## 2022-03-12 DIAGNOSIS — K219 Gastro-esophageal reflux disease without esophagitis: Secondary | ICD-10-CM

## 2022-03-12 DIAGNOSIS — K222 Esophageal obstruction: Secondary | ICD-10-CM | POA: Diagnosis not present

## 2022-03-12 MED ORDER — PANTOPRAZOLE SODIUM 40 MG PO TBEC: 40.0000 mg | DELAYED_RELEASE_TABLET | Freq: Two times a day (BID) | ORAL | 3 refills | Status: DC

## 2022-03-12 NOTE — Progress Notes (Signed)
HISTORY OF PRESENT ILLNESS:  Mark Booth is a 62 y.o. male, disabled from bipolar disorder, with a history of GERD complicated by erosive esophagitis and peptic stricture requiring esophageal patient.  She presents today for follow-up.  Last seen in the office October 09, 2021.  He was continued on pantoprazole at that time.  Subsequent upper endoscopy October 24, 2021 revealed EOE type esophagus.  He was dilated to 18 mm via balloon.  PPI was increased to twice daily.  Follow-up upper endoscopy with dilation December 06, 2021.  Maloney dilators to 28 [place].  Continued on twice daily PPI.  Office follow-up at this time.  Patient reports being compliant with twice daily PPI therapy.  No reflux symptoms.  He also reports complete resolution of dysphagia.  He is pleased.  Few weeks ago, he did have transient problems with nausea and vomiting consistent with gastroenteritis, which is now resolved.  He was prescribed Zofran by his PCP, but has not needed.  No new complaints  REVIEW OF SYSTEMS:  All non-GI ROS negative unless otherwise stated in the HPI  Past Medical History:  Diagnosis Date   Allergic rhinitis    Allergy    AS A CHILD   Asthma    Bipolar disorder ([place])    Cataract    BILATERAL-REMOVED   Depression    Diabetes mellitus without complication ([place])    GERD (gastroesophageal reflux disease)    Hyperlipidemia    Hypertension    Hypothyroidism    Osteoarthritis    Overweight(278.02)    Unspecified vitamin D deficiency     Past Surgical History:  Procedure Laterality Date   APPENDECTOMY     BIOPSY  09/30/2021   Procedure: BIOPSY;  Surgeon: Thornton Park, MD;  Location: [facility];  Service: Gastroenterology;;   CATARACT EXTRACTION  12-2012   BILATERAL   COLONOSCOPY     ESOPHAGOGASTRODUODENOSCOPY (EGD) WITH PROPOFOL N/A 09/30/2021   Procedure: ESOPHAGOGASTRODUODENOSCOPY (EGD) WITH PROPOFOL;  Surgeon: Thornton Park, MD;  Location: [facility];  Service:  Gastroenterology;  Laterality: N/A;   POLYPECTOMY     SHOULDER SURGERY  2013    Social History Lon [employer]ard  reports that he has never smoked. He has never used smokeless tobacco. He reports current alcohol use of about 42.0 standard drinks of alcohol per week. He reports that he does not use drugs.  family history includes Colon cancer in his paternal grandfather; Depression in his mother; Diabetes in his mother; Hypothyroidism in his mother; Osteoporosis in his mother; Pancreatic cancer in his mother; Prostate cancer in his father.  No Known Allergies     PHYSICAL EXAMINATION: Vital signs: BP 138/78   Pulse 88   Ht 6' (1.829 m)   Wt 231 lb 9.6 oz (105.1 kg)   SpO2 94%   BMI 31.41 kg/m   Constitutional: generally well-appearing, no acute distress Psychiatric: alert and oriented x3, cooperative Eyes: Anicteric Abdomen: Not reexamined Extremities: no abnormalities on visible extremities Skin: no lesions on visible extremities Neuro: Gross deficits  ASSESSMENT:  1.  GERD complicated by erosive esophagitis and peptic stricture.  Asymptomatic post dilation on twice daily PPI 2.  History of adenomatous colon polyps.  Last colonoscopy October 2020 3.  Gastroenteritis with transient nausea vomiting.  Resolved.  He has Zofran if needed  PLAN:  1.  Reflux precautions 2.  Continue pantoprazole 40 mg twice daily.  Prescription refilled.  Medication risks reviewed 3.  Routine office follow-up in 1 month.  However, he has been told  to contact the office in the interim should he have worsening of his reflux or recurrent dysphagia. 4.  Surveillance colonoscopy around October 2025 30 minutes spent preparing to see the patient, obtaining interval history, performing medically appropriate physical exam, ordering medication, counseling and educating the patient regarding the above listed issues, and documenting clinical information in the health record

## 2022-03-12 NOTE — Patient Instructions (Signed)
_______________________________________________________  If your blood pressure at your visit was 140/90 or greater, please contact your primary care physician to follow up on this.  _______________________________________________________  If you are age 60 or older, your body mass index should be between 23-30. Your Body mass index is 31.41 kg/m. If this is out of the aforementioned range listed, please consider follow up with your Primary Care Provider.  If you are age 54 or younger, your body mass index should be between 19-25. Your Body mass index is 31.41 kg/m. If this is out of the aformentioned range listed, please consider follow up with your Primary Care Provider.   ________________________________________________________  The [facility] providers would like to encourage you to use [facility] to communicate with providers for non-urgent requests or questions.  Due to long hold times on the telephone, sending your provider a message by [facility] may be a faster and more efficient way to get a response.  Please allow 48 business hours for a response.  Please remember that this is for non-urgent requests.  _______________________________________________________  We have sent the following medications to your pharmacy for you to pick up at your convenience:  Protonix

## 2022-03-26 ENCOUNTER — Other Ambulatory Visit: Payer: Self-pay | Admitting: Internal Medicine

## 2022-04-23 ENCOUNTER — Other Ambulatory Visit: Payer: Self-pay | Admitting: Internal Medicine

## 2022-05-01 ENCOUNTER — Telehealth: Payer: Self-pay

## 2022-05-01 NOTE — Telephone Encounter (Signed)
Called patient to schedule Medicare Annual Wellness Visit (AWV). Left message for patient to call back and schedule Medicare Annual Wellness Visit (AWV).  Last date of AWV: 05/07/21  Please schedule an appointment at any time with [facility].    Norton Blizzard, [place] ([facility])  [place] [facility] [phone]

## 2022-05-02 ENCOUNTER — Telehealth: Payer: Self-pay

## 2022-05-02 NOTE — Telephone Encounter (Signed)
Contacted Mark Booth to schedule their annual wellness visit. Appointment made for 05/09/22.  Norton Blizzard, [place] ([facility])  [place] [facility] [phone]

## 2022-05-09 ENCOUNTER — Ambulatory Visit (INDEPENDENT_AMBULATORY_CARE_PROVIDER_SITE_OTHER): Payer: Medicare Other

## 2022-05-09 VITALS — Wt 220.0 lb

## 2022-05-09 DIAGNOSIS — Z0000 Encounter for general adult medical examination without abnormal findings: Secondary | ICD-10-CM

## 2022-05-09 NOTE — Progress Notes (Signed)
I connected with  Alexsander Mattie on 05/09/22 by a audio enabled telemedicine application and verified taudiohat I am speaking with the correct person using two identifiers.  Patient Location: Home  Provider Location: Office/Clinic  I discussed the limitations of evaluation and management by telemedicine. The patient expressed understanding and agreed to proceed.   Patient Medicare AWV questionnaire was completed by the patient on 05/02/2022; I have confirmed that all information answered by patient is correct and no changes since this date.    Subjective:   Mark Booth is a 62 y.o. male who presents for Medicare Annual/Subsequent preventive examination.  Review of Systems     Cardiac Risk Factors include: advanced age (>41men, >32 women);diabetes mellitus;dyslipidemia;hypertension;male gender     Objective:    Today's Vitals   05/09/22 1106  Weight: 220 lb (99.8 kg)  PainSc: 0-No pain   Body mass index is 29.84 kg/m.     05/09/2022   11:07 AM 09/30/2021    5:28 PM 09/30/2021    3:02 PM 05/07/2021    2:41 PM 04/25/2020    1:09 PM 01/19/2014    3:06 PM  Advanced Directives  Does Patient Have a Medical Advance Directive? No No No No No No  Would patient like information on creating a medical advance directive? No - Patient declined No - Patient declined  No - Patient declined No - Patient declined No - patient declined information    Current Medications (verified) Outpatient Encounter Medications as of 05/09/2022  Medication Sig   albuterol (VENTOLIN HFA) 108 (90 Base) MCG/ACT inhaler INHALE 2 PUFFS BY MOUTH EVERY 6 HOURS AS NEEDED   ARIPiprazole (ABILIFY) 10 MG tablet Take 10 mg by mouth daily.   Blood Glucose Monitoring Suppl (CONTOUR NEXT ONE) DEVI 1 Device by Does not apply route daily.   citalopram (CELEXA) 40 MG tablet Take 40 mg by mouth at bedtime.   fenofibrate 160 MG tablet Take 1 tablet by mouth once daily   Fluticasone-Salmeterol (ADVAIR) 100-50 MCG/DOSE AEPB  Inhale 1 puff into the lungs 2 (two) times daily.   glimepiride (AMARYL) 1 MG tablet Take 1 tablet (1 mg total) by mouth daily with breakfast.   glucose blood (CONTOUR NEXT TEST) test strip 1 each by Other route daily. Use as instructed   lamoTRIgine (LAMICTAL) 100 MG tablet Take 100 mg by mouth daily.   levothyroxine (SYNTHROID) 150 MCG tablet TAKE 1 TABLET BY MOUTH ONCE DAILY BEFORE BREAKFAST   lisinopril-hydrochlorothiazide (ZESTORETIC) 10-12.5 MG tablet Take 1 tablet by mouth once daily   Microlet Lancets MISC 1 Stick by Does not apply route daily.   naltrexone (DEPADE) 50 MG tablet Take 50 mg by mouth every morning.   nebivolol (BYSTOLIC) 10 MG tablet Take 1 tablet (10 mg total) by mouth daily.   ondansetron (ZOFRAN) 4 MG tablet TAKE 1 TABLET BY MOUTH EVERY 8 HOURS AS NEEDED FOR NAUSEA OR VOMITING   pantoprazole (PROTONIX) 40 MG tablet Take 1 tablet (40 mg total) by mouth 2 (two) times daily.   Semaglutide (RYBELSUS) 14 MG TABS Take 1 tablet (14 mg total) by mouth daily.   simvastatin (ZOCOR) 40 MG tablet Take 1 tablet (40 mg total) by mouth daily at 6 PM. Annual appt W/LABS ARE due in OCT must see provider for future refills   solifenacin (VESICARE) 5 MG tablet Take 5 mg by mouth daily.   timolol (TIMOPTIC) 0.5 % ophthalmic solution 1 drop 2 (two) times daily.   No facility-administered encounter medications on file  as of 05/09/2022.    Allergies (verified) Patient has no known allergies.   History: Past Medical History:  Diagnosis Date   Allergic rhinitis    Allergy    AS A CHILD   Asthma    Bipolar disorder ([place])    Cataract    BILATERAL-REMOVED   Depression    Diabetes mellitus without complication (HCC)    GERD (gastroesophageal reflux disease)    Hyperlipidemia    Hypertension    Hypothyroidism    Osteoarthritis    Overweight(278.02)    Unspecified vitamin D deficiency    Past Surgical History:  Procedure Laterality Date   APPENDECTOMY     BIOPSY  09/30/2021    Procedure: BIOPSY;  Surgeon: Thornton Park, MD;  Location: [facility];  Service: Gastroenterology;;   CATARACT EXTRACTION  12-2012   BILATERAL   COLONOSCOPY     ESOPHAGOGASTRODUODENOSCOPY (EGD) WITH PROPOFOL N/A 09/30/2021   Procedure: ESOPHAGOGASTRODUODENOSCOPY (EGD) WITH PROPOFOL;  Surgeon: Thornton Park, MD;  Location: [facility];  Service: Gastroenterology;  Laterality: N/A;   POLYPECTOMY     SHOULDER SURGERY  2013   Family History  Problem Relation Age of Onset   Osteoporosis Mother    Depression Mother    Diabetes Mother    Hypothyroidism Mother    Pancreatic cancer Mother    Prostate cancer Father    Colon cancer Paternal Grandfather    Rectal cancer Neg Hx    Stomach cancer Neg Hx    Esophageal cancer Neg Hx    Social History   Socioeconomic History   Marital status: Single    Spouse name: Not on file   Number of children: Not on file   Years of education: Not on file   Highest education level: Not on file  Occupational History   Occupation: Unemployed  Tobacco Use   Smoking status: Never   Smokeless tobacco: Never  Vaping Use   Vaping Use: Never used  Substance and Sexual Activity   Alcohol use: Yes    Alcohol/week: 42.0 standard drinks of alcohol    Types: 42 Cans of beer per week   Drug use: No   Sexual activity: Not Currently  Other Topics Concern   Not on file  Social History Narrative   Not on file   Social Determinants of Health   Financial Resource Strain: Medium Risk (05/09/2022)   Overall Financial Resource Strain (CARDIA)    Difficulty of Paying Living Expenses: Somewhat hard  Food Insecurity: Food Insecurity Present (05/09/2022)   Hunger Vital Sign    Worried About Running Out of Food in the Last Year: Sometimes true    Ran Out of Food in the Last Year: Sometimes true  Transportation Needs: No Transportation Needs (05/09/2022)   PRAPARE - Hydrologist (Medical): No    Lack of Transportation  (Non-Medical): No  Physical Activity: Insufficiently Active (05/09/2022)   Exercise Vital Sign    Days of Exercise per Week: 2 days    Minutes of Exercise per Session: 60 min  Stress: No Stress Concern Present (05/09/2022)   [place]    Feeling of Stress : Only a little  Social Connections: Unknown (05/09/2022)   Social Connection and Isolation Panel [NHANES]    Frequency of Communication with Friends and Family: More than three times a week    Frequency of Social Gatherings with Friends and Family: Once a week    Attends Religious Services:  Not on file    Active Member of Clubs or Organizations: Yes    Attends Club or Organization Meetings: More than 4 times per year    Marital Status: Never married    Tobacco Counseling Counseling given: Not Answered   Clinical Intake:  Pre-visit preparation completed: Yes  Pain : No/denies pain Pain Score: 0-No pain     Nutritional Risks: None Diabetes: No  How often do you need to have someone help you when you read instructions, pamphlets, or other written materials from your doctor or pharmacy?: 1 - Never What is the last grade level you completed in school?: HSG  Nutrition Risk Assessment:  Has the patient had any N/V/D within the last 2 months?  Yes  Does the patient have any non-healing wounds?  No  Has the patient had any unintentional weight loss or weight gain?  No   Diabetes:  Is the patient diabetic?  Yes  If diabetic, was a CBG obtained today?  No  Did the patient bring in their glucometer from home?  No  How often do you monitor your CBG's? 1 per day.   Financial Strains and Diabetes Management:  Are you having any financial strains with the device, your supplies or your medication? No .  Does the patient want to be seen by Chronic Care Management for management of their diabetes?  Yes  Would the patient like to be referred to a Nutritionist or for  Diabetic Management?  No   Diabetic Exams:  Diabetic Eye Exam: Completed 06/29/2021 Diabetic Foot Exam: Completed 09/11/2021   Interpreter Needed?: No  Information entered by :: Lisette Abu, LPN.   Activities of Daily Living    05/09/2022   11:15 AM 05/02/2022   11:43 PM  In your present state of health, do you have any difficulty performing the following activities:  Hearing? 0 0  Vision? 0 0  Difficulty concentrating or making decisions? 0 0  Walking or climbing stairs? 0 0  Dressing or bathing? 0 0  Doing errands, shopping? 0 0  Preparing Food and eating ? N N  Using the Toilet? N N  In the past six months, have you accidently leaked urine? N N  Do you have problems with loss of bowel control? N N  Managing your Medications? N N  Managing your Finances? N N  Housekeeping or managing your Housekeeping? N N    Patient Care Team: Hoyt Koch, MD as PCP - General (Internal Medicine) Charlton Haws, [facility] as Pharmacist (Pharmacist) Lisabeth Pick, MD as Consulting Physician (Ophthalmology)  Indicate any recent Medical Services you may have received from other than [facility] providers in the past year (date may be approximate).     Assessment:   This is a routine wellness examination for Mark Booth.  Hearing/Vision screen Hearing Screening - Comments:: Denies hearing difficulties   Vision Screening - Comments:: Wears rx glasses - up to date with routine eye exams with Arnoldo Hooker, MD   Dietary issues and exercise activities discussed: Current Exercise Habits: Home exercise routine, Type of exercise: walking;treadmill;stretching;strength training/weights, Time (Minutes): 60, Frequency (Times/Week): 2, Weekly Exercise (Minutes/Week): 120, Intensity: Moderate, Exercise limited by: Other - see comments (CHRONIC FATIGUE)   Goals Addressed             This Visit's Progress    Client will verbalize knowledge of diabetes self-management as evidenced by Hgb A1C <7  or as defined by provider.  Depression Screen    05/09/2022   11:16 AM 03/05/2022    3:33 PM 12/05/2021   11:01 AM 05/07/2021    2:42 PM 05/07/2021    2:40 PM 04/25/2020    1:11 PM 10/05/2018    1:13 PM  PHQ 2/9 Scores  PHQ - 2 Score 0 0 0 0 0 4 1  PHQ- 9 Score 3  0        Fall Risk    05/09/2022   11:08 AM 05/02/2022   11:43 PM 03/05/2022    3:32 PM 12/05/2021   11:01 AM 05/07/2021    2:42 PM  [place] in the past year? 0 0 0 0 0  Number falls in past yr: 0 0 0 0 0  Injury with Fall? 0 0 0 0 0  Risk for fall due to : No Fall Risks  No Fall Risks    Follow up Falls prevention discussed  Falls evaluation completed Falls evaluation completed Falls evaluation completed    [place]:  Any stairs in or around the home? No  If so, are there any without handrails? No  Home free of loose throw rugs in walkways, pet beds, electrical cords, etc? Yes  Adequate lighting in your home to reduce risk of falls? Yes   ASSISTIVE DEVICES UTILIZED TO PREVENT FALLS:  Life alert? No  Use of a cane, walker or w/c? No  Grab bars in the bathroom? Yes  Shower chair or bench in shower? Yes  Elevated toilet seat or a handicapped toilet? Yes   TIMED UP AND GO:  Was the test performed? No . Telephonic Visit  Cognitive Function:        05/09/2022   11:15 AM  6CIT Screen  What Year? 0 points  What month? 0 points  What time? 0 points  Count back from 20 0 points  Months in reverse 0 points  Repeat phrase 0 points  Total Score 0 points    Immunizations Immunization History  Administered Date(s) Administered   Influenza,inj,Quad PF,6+ Mos 04/24/2018, 10/25/2019, 12/05/2021   Moderna SARS-COV2 Booster Vaccination 01/14/2022   Moderna Sars-Covid-2 Vaccination 06/22/2019, 07/20/2019   Zoster Recombinat (Shingrix) 10/28/2019    TDAP status: Due, Education has been provided regarding the importance of this vaccine. Advised may receive  this vaccine at local pharmacy or Health Dept. Aware to provide a copy of the vaccination record if obtained from local pharmacy or Health Dept. Verbalized acceptance and understanding.  Flu Vaccine status: Up to date  Pneumococcal vaccine status: Due, Education has been provided regarding the importance of this vaccine. Advised may receive this vaccine at local pharmacy or Health Dept. Aware to provide a copy of the vaccination record if obtained from local pharmacy or Health Dept. Verbalized acceptance and understanding.  Covid-19 vaccine status: Completed vaccines  Qualifies for Shingles Vaccine? Yes   Zostavax completed No   Shingrix Completed?: No.    Education has been provided regarding the importance of this vaccine. Patient has been advised to call insurance company to determine out of pocket expense if they have not yet received this vaccine. Advised may also receive vaccine at local pharmacy or Health Dept. Verbalized acceptance and understanding.  Screening Tests Health Maintenance  Topic Date Due   DTaP/Tdap/Td (1 - Tdap) Never done   Zoster Vaccines- Shingrix (2 of 2) 12/23/2019   COVID-19 Vaccine (3 - Moderna risk series) 02/11/2022   OPHTHALMOLOGY EXAM  06/30/2022  HEMOGLOBIN A1C  09/03/2022   Diabetic kidney evaluation - Urine ACR  09/12/2022   FOOT EXAM  09/12/2022   Diabetic kidney evaluation - eGFR measurement  03/06/2023   Medicare Annual Wellness (AWV)  05/09/2023   COLONOSCOPY (Pts 45-18yrs Insurance coverage will need to be confirmed)  11/18/2023   INFLUENZA VACCINE  Completed   Hepatitis C Screening  Completed   HIV Screening  Completed   HPV VACCINES  Aged Out    Health Maintenance  Health Maintenance Due  Topic Date Due   DTaP/Tdap/Td (1 - Tdap) Never done   Zoster Vaccines- Shingrix (2 of 2) 12/23/2019   COVID-19 Vaccine (3 - Moderna risk series) 02/11/2022    Colorectal cancer screening: Type of screening: Colonoscopy. Completed 11/18/2018. Repeat  every 5 years  Lung Cancer Screening: (Low Dose CT Chest recommended if Age 34-80 years, 30 pack-year currently smoking OR have quit w/in 15years.) does not qualify.   Lung Cancer Screening Referral: no  Additional Screening:  Hepatitis C Screening: does qualify; Completed 06/19/2021  Vision Screening: Recommended annual ophthalmology exams for early detection of glaucoma and other disorders of the eye. Is the patient up to date with their annual eye exam?  Yes  Who is the provider or what is the name of the office in which the patient attends annual eye exams? Arnoldo Hooker, MD If pt is not established with a provider, would they like to be referred to a provider to establish care? No .   Dental Screening: Recommended annual dental exams for proper oral hygiene  Community Resource Referral / Chronic Care Management: CRR required this visit?  No   CCM required this visit?  No      Plan:     I have personally reviewed and noted the following in the patient's chart:   Medical and social history Use of alcohol, tobacco or illicit drugs  Current medications and supplements including opioid prescriptions. Patient is not currently taking opioid prescriptions. Functional ability and status Nutritional status Physical activity Advanced directives List of other physicians Hospitalizations, surgeries, and ER visits in previous 12 months Vitals Screenings to include cognitive, depression, and falls Referrals and appointments  In addition, I have reviewed and discussed with patient certain preventive protocols, quality metrics, and best practice recommendations. A written personalized care plan for preventive services as well as general preventive health recommendations were provided to patient.     Sheral Flow, LPN   [id number]   Nurse Notes:  Normal cognitive status assessed by direct observation by this Nurse Health Advisor. No abnormalities found.   Patient Medicare AWV  questionnaire was completed by the patient on 05/02/2022; I have confirmed that all information answered by patient is correct and no changes since this date.

## 2022-05-09 NOTE — Patient Instructions (Signed)
[name] , Thank you for taking time to come for your Medicare Wellness Visit. I appreciate your ongoing commitment to your health goals. Please review the following plan we discussed and let me know if I can assist you in the future.   These are the goals we discussed:  Goals      Client will verbalize knowledge of diabetes self-management as evidenced by Hgb A1C <7 or as defined by provider.            This is a list of the screening recommended for you and due dates:  Health Maintenance  Topic Date Due   DTaP/Tdap/Td vaccine (1 - Tdap) Never done   Zoster (Shingles) Vaccine (2 of 2) 12/23/2019   COVID-19 Vaccine (3 - Moderna risk series) 02/11/2022   Eye exam for diabetics  06/30/2022   Hemoglobin A1C  09/03/2022   Yearly kidney health urinalysis for diabetes  09/12/2022   Complete foot exam   09/12/2022   Yearly kidney function blood test for diabetes  03/06/2023   Medicare Annual Wellness Visit  05/09/2023   Colon Cancer Screening  11/18/2023   Flu Shot  Completed   Hepatitis C Screening: USPSTF Recommendation to screen - Ages 18-79 yo.  Completed   HIV Screening  Completed   HPV Vaccine  Aged Out    Advanced directives: No  Conditions/risks identified: Yes; Type II Diabetes  Next appointment: Follow up in one year for your annual wellness visit.  Preventive Care 40-64 Years, Male Preventive care refers to lifestyle choices and visits with your health care provider that can promote health and wellness. What does preventive care include? A yearly physical exam. This is also called an annual well check. Dental exams once or twice a year. Routine eye exams. Ask your health care provider how often you should have your eyes checked. Personal lifestyle choices, including: Daily care of your teeth and gums. Regular physical activity. Eating a healthy diet. Avoiding tobacco and drug use. Limiting alcohol use. Practicing safe sex. Taking low-dose aspirin every day  starting at age 61. What happens during an annual well check? The services and screenings done by your health care provider during your annual well check will depend on your age, overall health, lifestyle risk factors, and family history of disease. Counseling  Your health care provider may ask you questions about your: Alcohol use. Tobacco use. Drug use. Emotional well-being. Home and relationship well-being. Sexual activity. Eating habits. Work and work Statistician. Screening  You may have the following tests or measurements: Height, weight, and BMI. Blood pressure. Lipid and cholesterol levels. These may be checked every 5 years, or more frequently if you are over 82 years old. Skin check. Lung cancer screening. You may have this screening every year starting at age 41 if you have a 30-pack-year history of smoking and currently smoke or have quit within the past 15 years. Fecal occult blood test (FOBT) of the stool. You may have this test every year starting at age 42. Flexible sigmoidoscopy or colonoscopy. You may have a sigmoidoscopy every 5 years or a colonoscopy every 10 years starting at age 25. Prostate cancer screening. Recommendations will vary depending on your family history and other risks. Hepatitis C blood test. Hepatitis B blood test. Sexually transmitted disease (STD) testing. Diabetes screening. This is done by checking your blood sugar (glucose) after you have not eaten for a while (fasting). You may have this done every 1-3 years. Discuss your test results, treatment options,  and if necessary, the need for more tests with your health care provider. Vaccines  Your health care provider may recommend certain vaccines, such as: Influenza vaccine. This is recommended every year. Tetanus, diphtheria, and acellular pertussis (Tdap, Td) vaccine. You may need a Td booster every 10 years. Zoster vaccine. You may need this after age 57. Pneumococcal 13-valent conjugate  (PCV13) vaccine. You may need this if you have certain conditions and have not been vaccinated. Pneumococcal polysaccharide (PPSV23) vaccine. You may need one or two doses if you smoke cigarettes or if you have certain conditions. Talk to your health care provider about which screenings and vaccines you need and how often you need them. This information is not intended to replace advice given to you by your health care provider. Make sure you discuss any questions you have with your health care provider. Document Released: 02/24/2015 Document Revised: 10/18/2015 Document Reviewed: 11/29/2014 Elsevier Interactive Patient Education  2017 [place] Prevention in the Home Falls can cause injuries. They can happen to people of all ages. There are many things you can do to make your home safe and to help prevent falls. What can I do on the outside of my home? Regularly fix the edges of walkways and driveways and fix any cracks. Remove anything that might make you trip as you walk through a door, such as a raised step or threshold. Trim any bushes or trees on the path to your home. Use bright outdoor lighting. Clear any walking paths of anything that might make someone trip, such as rocks or tools. Regularly check to see if handrails are loose or broken. Make sure that both sides of any steps have handrails. Any raised decks and porches should have guardrails on the edges. Have any leaves, snow, or ice cleared regularly. Use sand or salt on walking paths during winter. Clean up any spills in your garage right away. This includes oil or grease spills. What can I do in the bathroom? Use night lights. Install grab bars by the toilet and in the tub and shower. Do not use towel bars as grab bars. Use non-skid mats or decals in the tub or shower. If you need to sit down in the shower, use a plastic, non-slip stool. Keep the floor dry. Clean up any water that spills on the floor as soon as it  happens. Remove soap buildup in the tub or shower regularly. Attach bath mats securely with double-sided non-slip rug tape. Do not have throw rugs and other things on the floor that can make you trip. What can I do in the bedroom? Use night lights. Make sure that you have a light by your bed that is easy to reach. Do not use any sheets or blankets that are too big for your bed. They should not hang down onto the floor. Have a firm chair that has side arms. You can use this for support while you get dressed. Do not have throw rugs and other things on the floor that can make you trip. What can I do in the kitchen? Clean up any spills right away. Avoid walking on wet floors. Keep items that you use a lot in easy-to-reach places. If you need to reach something above you, use a strong step stool that has a grab bar. Keep electrical cords out of the way. Do not use floor polish or wax that makes floors slippery. If you must use wax, use non-skid floor wax. Do not have throw  rugs and other things on the floor that can make you trip. What can I do with my stairs? Do not leave any items on the stairs. Make sure that there are handrails on both sides of the stairs and use them. Fix handrails that are broken or loose. Make sure that handrails are as long as the stairways. Check any carpeting to make sure that it is firmly attached to the stairs. Fix any carpet that is loose or worn. Avoid having throw rugs at the top or bottom of the stairs. If you do have throw rugs, attach them to the floor with carpet tape. Make sure that you have a light switch at the top of the stairs and the bottom of the stairs. If you do not have them, ask someone to add them for you. What else can I do to help prevent falls? Wear shoes that: Do not have high heels. Have rubber bottoms. Are comfortable and fit you well. Are closed at the toe. Do not wear sandals. If you use a stepladder: Make sure that it is fully opened.  Do not climb a closed stepladder. Make sure that both sides of the stepladder are locked into place. Ask someone to hold it for you, if possible. Clearly mark and make sure that you can see: Any grab bars or handrails. First and last steps. Where the edge of each step is. Use tools that help you move around (mobility aids) if they are needed. These include: Canes. Walkers. Scooters. Crutches. Turn on the lights when you go into a dark area. Replace any light bulbs as soon as they burn out. Set up your furniture so you have a clear path. Avoid moving your furniture around. If any of your floors are uneven, fix them. If there are any pets around you, be aware of where they are. Review your medicines with your doctor. Some medicines can make you feel dizzy. This can increase your chance of falling. Ask your doctor what other things that you can do to help prevent falls. This information is not intended to replace advice given to you by your health care provider. Make sure you discuss any questions you have with your health care provider. Document Released: 11/24/2008 Document Revised: 07/06/2015 Document Reviewed: 03/04/2014 Elsevier Interactive Patient Education  2017 [employer].

## 2022-05-25 ENCOUNTER — Other Ambulatory Visit: Payer: Self-pay | Admitting: Internal Medicine

## 2022-06-21 LAB — HM DIABETES EYE EXAM

## 2022-06-24 ENCOUNTER — Encounter: Payer: Self-pay | Admitting: Internal Medicine

## 2022-06-28 ENCOUNTER — Encounter: Payer: Self-pay | Admitting: Internal Medicine

## 2022-08-06 ENCOUNTER — Other Ambulatory Visit: Payer: Self-pay | Admitting: Internal Medicine

## 2022-08-06 DIAGNOSIS — E039 Hypothyroidism, unspecified: Secondary | ICD-10-CM

## 2022-08-09 ENCOUNTER — Other Ambulatory Visit: Payer: Self-pay | Admitting: Internal Medicine

## 2022-08-19 ENCOUNTER — Other Ambulatory Visit: Payer: Self-pay | Admitting: Internal Medicine

## 2022-08-23 ENCOUNTER — Other Ambulatory Visit: Payer: Self-pay | Admitting: Internal Medicine

## 2022-09-04 ENCOUNTER — Other Ambulatory Visit: Payer: Self-pay | Admitting: Internal Medicine

## 2022-09-09 ENCOUNTER — Other Ambulatory Visit: Payer: Self-pay | Admitting: Internal Medicine

## 2022-09-28 ENCOUNTER — Other Ambulatory Visit: Payer: Self-pay | Admitting: Internal Medicine

## 2022-10-28 ENCOUNTER — Other Ambulatory Visit: Payer: Self-pay | Admitting: Internal Medicine

## 2022-11-09 ENCOUNTER — Other Ambulatory Visit: Payer: Self-pay | Admitting: Internal Medicine

## 2022-11-14 ENCOUNTER — Other Ambulatory Visit: Payer: Self-pay | Admitting: Internal Medicine

## 2022-11-18 ENCOUNTER — Other Ambulatory Visit: Payer: Self-pay | Admitting: Internal Medicine

## 2022-11-29 ENCOUNTER — Other Ambulatory Visit: Payer: Self-pay | Admitting: Internal Medicine

## 2022-12-09 ENCOUNTER — Ambulatory Visit (INDEPENDENT_AMBULATORY_CARE_PROVIDER_SITE_OTHER): Payer: Medicare Other | Admitting: Internal Medicine

## 2022-12-09 ENCOUNTER — Encounter: Payer: Self-pay | Admitting: Internal Medicine

## 2022-12-09 ENCOUNTER — Other Ambulatory Visit: Payer: Self-pay | Admitting: Internal Medicine

## 2022-12-09 VITALS — BP 140/90 | HR 58 | Temp 97.9°F | Ht 72.0 in | Wt 217.0 lb

## 2022-12-09 DIAGNOSIS — K760 Fatty (change of) liver, not elsewhere classified: Secondary | ICD-10-CM

## 2022-12-09 DIAGNOSIS — Z7984 Long term (current) use of oral hypoglycemic drugs: Secondary | ICD-10-CM

## 2022-12-09 DIAGNOSIS — E785 Hyperlipidemia, unspecified: Secondary | ICD-10-CM

## 2022-12-09 DIAGNOSIS — E039 Hypothyroidism, unspecified: Secondary | ICD-10-CM

## 2022-12-09 DIAGNOSIS — E1169 Type 2 diabetes mellitus with other specified complication: Secondary | ICD-10-CM | POA: Diagnosis not present

## 2022-12-09 DIAGNOSIS — E559 Vitamin D deficiency, unspecified: Secondary | ICD-10-CM

## 2022-12-09 DIAGNOSIS — F1010 Alcohol abuse, uncomplicated: Secondary | ICD-10-CM

## 2022-12-09 DIAGNOSIS — I10 Essential (primary) hypertension: Secondary | ICD-10-CM

## 2022-12-09 DIAGNOSIS — F319 Bipolar disorder, unspecified: Secondary | ICD-10-CM

## 2022-12-09 LAB — CBC
HCT: 40.6 % (ref 39.0–52.0)
Hemoglobin: 13.3 g/dL (ref 13.0–17.0)
MCHC: 32.8 g/dL (ref 30.0–36.0)
MCV: 94.4 fL (ref 78.0–100.0)
Platelets: 265 10*3/uL (ref 150.0–400.0)
RBC: 4.3 Mil/uL (ref 4.22–5.81)
RDW: 13.8 % (ref 11.5–15.5)
WBC: 9.1 10*3/uL (ref 4.0–10.5)

## 2022-12-09 LAB — HEMOGLOBIN A1C: Hgb A1c MFr Bld: 5.7 % (ref 4.6–6.5)

## 2022-12-09 LAB — LIPID PANEL
Cholesterol: 153 mg/dL (ref 0–200)
HDL: 53.2 mg/dL (ref >39.00)
LDL Cholesterol: 83 mg/dL (ref 0–99)
NonHDL: 99.39
Total CHOL/HDL Ratio: 3
Triglycerides: 82 mg/dL (ref 0.0–149.0)
VLDL: 16.4 mg/dL (ref 0.0–40.0)

## 2022-12-09 LAB — COMPREHENSIVE METABOLIC PANEL
ALT: 16 U/L (ref 0–53)
AST: 26 U/L (ref 0–37)
Albumin: 4.4 g/dL (ref 3.5–5.2)
Alkaline Phosphatase: 42 U/L (ref 39–117)
BUN: 12 mg/dL (ref 6–23)
CO2: 28 meq/L (ref 19–32)
Calcium: 9.6 mg/dL (ref 8.4–10.5)
Chloride: 109 meq/L (ref 96–112)
Creatinine, Ser: 0.97 mg/dL (ref 0.40–1.50)
GFR: 84.76 mL/min (ref >60.00)
Glucose, Bld: 75 mg/dL (ref 70–99)
Potassium: 4.2 meq/L (ref 3.5–5.1)
Sodium: 143 meq/L (ref 135–145)
Total Bilirubin: 0.5 mg/dL (ref 0.2–1.2)
Total Protein: 6.9 g/dL (ref 6.0–8.3)

## 2022-12-09 LAB — MICROALBUMIN / CREATININE URINE RATIO
Creatinine,U: 67.5 mg/dL
Microalb Creat Ratio: 1 mg/g (ref 0.0–30.0)
Microalb, Ur: <0.7 mg/dL (ref 0.0–1.9)

## 2022-12-09 LAB — T4, FREE: Free T4: 0.95 ng/dL (ref 0.60–1.60)

## 2022-12-09 LAB — TSH: TSH: 2.82 u[IU]/mL (ref 0.35–5.50)

## 2022-12-09 LAB — VITAMIN B12: Vitamin B-12: 249 pg/mL (ref 211–911)

## 2022-12-09 LAB — VITAMIN D 25 HYDROXY (VIT D DEFICIENCY, FRACTURES): VITD: 31.94 ng/mL (ref 30.00–100.00)

## 2022-12-09 NOTE — Progress Notes (Signed)
   Subjective:   Patient ID: Mark Booth, male    DOB: [birth year], 62 y.o.   MRN: [id number]  HPI The patient is a 62 YO man coming in for follow up. Has some new tingling in his feet several months. Still not drinking/drinking limited amounts of alcohol. Not taking naltrexone currently.   Review of Systems  Constitutional: Negative.   HENT: Negative.    Eyes: Negative.   Respiratory:  Negative for cough, chest tightness and shortness of breath.   Cardiovascular:  Negative for chest pain, palpitations and leg swelling.  Gastrointestinal:  Negative for abdominal distention, abdominal pain, constipation, diarrhea, nausea and vomiting.  Musculoskeletal: Negative.   Skin: Negative.   Neurological:  Positive for numbness.  Psychiatric/Behavioral: Negative.      Objective:  Physical Exam Constitutional:      Appearance: He is well-developed.  HENT:     Head: Normocephalic and atraumatic.  Cardiovascular:     Rate and Rhythm: Normal rate and regular rhythm.  Pulmonary:     Effort: Pulmonary effort is normal. No respiratory distress.     Breath sounds: Normal breath sounds. No wheezing or rales.  Abdominal:     General: Bowel sounds are normal. There is no distension.     Palpations: Abdomen is soft.     Tenderness: There is no abdominal tenderness. There is no rebound.  Musculoskeletal:     Cervical back: Normal range of motion.  Skin:    General: Skin is warm and dry.     Comments: Foot exam done  Neurological:     Mental Status: He is alert and oriented to person, place, and time.     Coordination: Coordination normal.     Vitals:   12/09/22 1311 12/09/22 1313  BP: (!) 140/90 (!) 140/90  Pulse: (!) 58   Temp: 97.9 F (36.6 C)   TempSrc: Oral   SpO2: 97%   Weight: 217 lb (98.4 kg)   Height: 6' (1.829 m)     Assessment & Plan:

## 2022-12-09 NOTE — Patient Instructions (Signed)
We will check the labs today. 

## 2022-12-09 NOTE — Assessment & Plan Note (Signed)
Checking TSH and adjust as needed levothyroxine 150 mcg daily.

## 2022-12-09 NOTE — Assessment & Plan Note (Signed)
BP at goal on bystolic 10 mg daily and lisinopril/hydrochlorothiazide 10/12.5 mg daily . Checking CMP and adjust as needed.

## 2022-12-09 NOTE — Assessment & Plan Note (Signed)
Checking lipid panel and adjust simvastatin as needed and fenofibrate.

## 2022-12-09 NOTE — Assessment & Plan Note (Addendum)
With new tingling in feet. Checking HgA1c, microalbumin to creatinine ratio, lipid panel, CMP. Last was uncontrolled and we added amaryl 1 mg daily back in Jan. He is not taking rybelsus (taking infrequently due to taste). Adjust as needed. Is taking statin and ACE-I.

## 2022-12-09 NOTE — Assessment & Plan Note (Signed)
Still with little alcohol use and stable for past year or so.

## 2022-12-09 NOTE — Assessment & Plan Note (Signed)
Checking vitamin D level and adjust as needed. New tingling in feet.

## 2022-12-09 NOTE — Assessment & Plan Note (Signed)
Overall well controlled and taking lamictal 100 mg daily and abilify 10 mg daily and celexa 40 mg daily.

## 2022-12-09 NOTE — Assessment & Plan Note (Signed)
Checking CMP.  

## 2022-12-15 ENCOUNTER — Other Ambulatory Visit: Payer: Self-pay | Admitting: Internal Medicine

## 2022-12-15 DIAGNOSIS — E039 Hypothyroidism, unspecified: Secondary | ICD-10-CM

## 2022-12-30 ENCOUNTER — Other Ambulatory Visit: Payer: Self-pay | Admitting: Internal Medicine

## 2023-01-21 ENCOUNTER — Other Ambulatory Visit: Payer: Self-pay | Admitting: Internal Medicine

## 2023-01-24 ENCOUNTER — Other Ambulatory Visit: Payer: Self-pay | Admitting: Internal Medicine

## 2023-01-28 ENCOUNTER — Encounter: Payer: Self-pay | Admitting: Internal Medicine

## 2023-01-28 ENCOUNTER — Other Ambulatory Visit: Payer: Self-pay

## 2023-01-28 NOTE — Telephone Encounter (Signed)
Ok to do

## 2023-01-30 LAB — HM DIABETES EYE EXAM

## 2023-01-31 ENCOUNTER — Telehealth: Payer: Self-pay

## 2023-01-31 ENCOUNTER — Other Ambulatory Visit: Payer: Self-pay

## 2023-01-31 NOTE — Telephone Encounter (Signed)
Copied from CRM [id number]. Topic: Clinical - Medication Refill >> Jan 31, 2023  9:41 AM Tiffany H wrote: Most Recent Primary Care Visit:  Provider: Hillard Danker A  Department: [facility]  Visit Type: OFFICE VISIT  Date: 12/09/2022  Medication: glimepiride (AMARYL) 1 MG tablet 1x  Has the patient contacted their pharmacy? Yes (Agent: If no, request that the patient contact the pharmacy for the refill. If patient does not wish to contact the pharmacy document the reason why and proceed with request.) (Agent: If yes, when and what did the pharmacy advise?) Requests have been sent. Medication has been pending since 01/24/23.   Is this the correct pharmacy for this prescription? Yes If no, delete pharmacy and type the correct one.  This is the patient's preferred pharmacy:   [facility] [address] [place] - [address] [place] [address] Phone: [phone] Fax: [phone]   Has the prescription been filled recently? No.   Is the patient out of the medication? No  Has the patient been seen for an appointment in the last year OR does the patient have an upcoming appointment? Yes  Can we respond through MyChart? Yes  Agent: Please be advised that Rx refills may take up to 3 business days. We ask that you follow-up with your pharmacy.

## 2023-01-31 NOTE — Telephone Encounter (Signed)
error 

## 2023-02-03 ENCOUNTER — Other Ambulatory Visit: Payer: Self-pay

## 2023-02-03 NOTE — Telephone Encounter (Signed)
Refilled

## 2023-03-12 ENCOUNTER — Other Ambulatory Visit: Payer: Self-pay | Admitting: Internal Medicine

## 2023-03-12 DIAGNOSIS — E039 Hypothyroidism, unspecified: Secondary | ICD-10-CM

## 2023-03-13 ENCOUNTER — Other Ambulatory Visit: Payer: Self-pay | Admitting: Internal Medicine

## 2023-03-18 ENCOUNTER — Telehealth: Payer: Self-pay | Admitting: Internal Medicine

## 2023-03-18 NOTE — Telephone Encounter (Signed)
 Copied from CRM [id number]. Topic: Clinical - Prescription Issue >> Mar 18, 2023 10:13 AM Corean SAUNDERS wrote: Reason for CRM: [facility] pharmacy states they need permission from Almarie Cleveland to change the manufacturers for patients levothyroxine  and states they have faxed this request over multiple times. Please call [phone] for questions or to give permission.

## 2023-03-18 NOTE — Telephone Encounter (Signed)
 Fine

## 2023-03-18 NOTE — Telephone Encounter (Signed)
Ok to change to Musician?

## 2023-03-18 NOTE — Telephone Encounter (Signed)
Called and there doing

## 2023-04-04 ENCOUNTER — Other Ambulatory Visit: Payer: Self-pay | Admitting: Internal Medicine

## 2023-04-20 ENCOUNTER — Other Ambulatory Visit: Payer: Self-pay | Admitting: Internal Medicine

## 2023-04-28 ENCOUNTER — Other Ambulatory Visit: Payer: Self-pay | Admitting: Internal Medicine

## 2023-05-07 ENCOUNTER — Other Ambulatory Visit: Payer: Self-pay | Admitting: Internal Medicine

## 2023-05-12 ENCOUNTER — Ambulatory Visit (INDEPENDENT_AMBULATORY_CARE_PROVIDER_SITE_OTHER): Payer: Medicare Other

## 2023-05-12 VITALS — Ht 72.0 in | Wt 209.0 lb

## 2023-05-12 DIAGNOSIS — Z0000 Encounter for general adult medical examination without abnormal findings: Secondary | ICD-10-CM | POA: Diagnosis not present

## 2023-05-12 NOTE — Patient Instructions (Addendum)
 [name] , Thank you for taking time to come for your Medicare Wellness Visit. I appreciate your ongoing commitment to your health goals. Please review the following plan we discussed and let me know if I can assist you in the future.   Referrals/Orders/Follow-Ups/Clinician Recommendations: Aim for 30 minutes of exercise or brisk walking, 6-8 glasses of water, and 5 servings of fruits and vegetables each day.   This is a list of the screening recommended for you and due dates:  Health Maintenance  Topic Date Due   Pneumococcal Vaccination (1 of 2 - PCV) Never done   DTaP/Tdap/Td vaccine (1 - Tdap) Never done   Zoster (Shingles) Vaccine (2 of 2) 12/23/2019   COVID-19 Vaccine (3 - Moderna risk series) 02/11/2022   Hemoglobin A1C  06/09/2023   Colon Cancer Screening  11/18/2023   Yearly kidney function blood test for diabetes  12/09/2023   Yearly kidney health urinalysis for diabetes  12/09/2023   Complete foot exam   12/09/2023   Eye exam for diabetics  01/30/2024   Medicare Annual Wellness Visit  05/11/2024   Flu Shot  Completed   Hepatitis C Screening  Completed   HIV Screening  Completed   HPV Vaccine  Aged Out    Advanced directives: (Declined) Advance directive discussed with you today. Even though you declined this today, please call our office should you change your mind, and we can give you the proper paperwork for you to fill out.  Next Medicare Annual Wellness Visit scheduled for next year: Yes

## 2023-05-12 NOTE — Progress Notes (Signed)
 Subjective:   Mark Booth is a 63 y.o. who presents for a Medicare Wellness preventive visit.  Visit Complete: Virtual I connected with  Mark Booth on 05/12/23 by a audio enabled telemedicine application and verified that I am speaking with the correct person using two identifiers.  Patient Location: Home  Provider Location: Office/Clinic  I discussed the limitations of evaluation and management by telemedicine. The patient expressed understanding and agreed to proceed.  Vital Signs: Because this visit was a virtual/telehealth visit, some criteria may be missing or patient reported. Any vitals not documented were not able to be obtained and vitals that have been documented are patient reported.  VideoDeclined- This patient declined Librarian, academic. Therefore the visit was completed with audio only.  Persons Participating in Visit: Patient.  AWV Questionnaire: Yes: Patient Medicare AWV questionnaire was completed by the patient on 05/08/2023; I have confirmed that all information answered by patient is correct and no changes since this date.  Cardiac Risk Factors include: male gender;advanced age (>9men, >68 women);diabetes mellitus;dyslipidemia;hypertension     Objective:    Today's Vitals   05/12/23 1041  Weight: 209 lb (94.8 kg)  Height: 6' (1.829 m)   Body mass index is 28.35 kg/m.     05/12/2023   10:38 AM 05/09/2022   11:07 AM 09/30/2021    5:28 PM 09/30/2021    3:02 PM 05/07/2021    2:41 PM 04/25/2020    1:09 PM 01/19/2014    3:06 PM  Advanced Directives  Does Patient Have a Medical Advance Directive? No No No No No No No  Would patient like information on creating a medical advance directive? No - Patient declined No - Patient declined No - Patient declined  No - Patient declined No - Patient declined No - patient declined information    Current Medications (verified) Outpatient Encounter Medications as of 05/12/2023  Medication  Sig   albuterol (VENTOLIN HFA) 108 (90 Base) MCG/ACT inhaler INHALE 2 PUFFS BY MOUTH EVERY 6 HOURS AS NEEDED   ARIPiprazole (ABILIFY) 10 MG tablet Take 10 mg by mouth daily.   citalopram (CELEXA) 40 MG tablet Take 40 mg by mouth at bedtime.   fenofibrate 160 MG tablet Take 1 tablet by mouth once daily   Fluticasone-Salmeterol (ADVAIR) 100-50 MCG/DOSE AEPB Inhale 1 puff into the lungs 2 (two) times daily.   glimepiride (AMARYL) 1 MG tablet Take 1 tablet by mouth once daily with breakfast   lamoTRIgine (LAMICTAL) 100 MG tablet Take 100 mg by mouth daily.   levothyroxine (SYNTHROID) 150 MCG tablet TAKE 1 TABLET BY MOUTH ONCE DAILY BEFORE BREAKFAST   lisinopril-hydrochlorothiazide (ZESTORETIC) 10-12.5 MG tablet Take 1 tablet by mouth once daily   nebivolol (BYSTOLIC) 10 MG tablet TAKE 1 TABLET BY MOUTH ONCE DAILY --ANNUAL  APPT  DUE  IN  AUGUST  --MUST  SEE  PROVIDER  FOR  FUTURE  REFILLS   ondansetron (ZOFRAN) 4 MG tablet TAKE 1 TABLET BY MOUTH EVERY 8 HOURS AS NEEDED FOR NAUSEA OR  VOMITING   pantoprazole (PROTONIX) 40 MG tablet Take 1 tablet (40 mg total) by mouth 2 (two) times daily.   Semaglutide (RYBELSUS) 14 MG TABS Take 1 tablet (14 mg total) by mouth daily.   simvastatin (ZOCOR) 40 MG tablet TAKE 1 TABLET BY MOUTH ONCE DAILY AT  6PM  --APPOINTMENT  REQUIRED  FOR  FUTURE  REFILLS   solifenacin (VESICARE) 5 MG tablet Take 5 mg by mouth daily.   timolol (  TIMOPTIC) 0.5 % ophthalmic solution 1 drop 2 (two) times daily.   [DISCONTINUED] Blood Glucose Monitoring Suppl (CONTOUR NEXT ONE) DEVI 1 Device by Does not apply route daily.   [DISCONTINUED] glucose blood (CONTOUR NEXT TEST) test strip 1 each by Other route daily. Use as instructed   [DISCONTINUED] Microlet Lancets MISC 1 Stick by Does not apply route daily.   No facility-administered encounter medications on file as of 05/12/2023.    Allergies (verified) Patient has no known allergies.   History: Past Medical History:  Diagnosis Date    Allergic rhinitis    Allergy    AS A CHILD   Asthma    Bipolar disorder (HCC)    Cataract    BILATERAL-REMOVED   Depression    Diabetes mellitus without complication (HCC)    GERD (gastroesophageal reflux disease)    Hyperlipidemia    Hypertension    Hypothyroidism    Osteoarthritis    Overweight(278.02)    Unspecified vitamin D deficiency    Past Surgical History:  Procedure Laterality Date   APPENDECTOMY     BIOPSY  09/30/2021   Procedure: BIOPSY;  Surgeon: Tressia Danas, MD;  Location: [facility];  Service: Gastroenterology;;   CATARACT EXTRACTION  12-2012   BILATERAL   COLONOSCOPY     ESOPHAGOGASTRODUODENOSCOPY (EGD) WITH PROPOFOL N/A 09/30/2021   Procedure: ESOPHAGOGASTRODUODENOSCOPY (EGD) WITH PROPOFOL;  Surgeon: Tressia Danas, MD;  Location: [facility];  Service: Gastroenterology;  Laterality: N/A;   POLYPECTOMY     SHOULDER SURGERY  2013   Family History  Problem Relation Age of Onset   Osteoporosis Mother    Depression Mother    Diabetes Mother    Hypothyroidism Mother    Pancreatic cancer Mother    Prostate cancer Father    Colon cancer Paternal Grandfather    Rectal cancer Neg Hx    Stomach cancer Neg Hx    Esophageal cancer Neg Hx    Social History   Socioeconomic History   Marital status: Single    Spouse name: Not on file   Number of children: Not on file   Years of education: Not on file   Highest education level: Not on file  Occupational History   Occupation: Unemployed  Tobacco Use   Smoking status: Never    Passive exposure: Never   Smokeless tobacco: Never  Vaping Use   Vaping status: Never Used  Substance and Sexual Activity   Alcohol use: Yes    Alcohol/week: 3.0 standard drinks of alcohol    Types: 3 Cans of beer per week    Comment: 3-4 beers per week   Drug use: No   Sexual activity: Not Currently  Other Topics Concern   Not on file  Social History Narrative   Not on file   Social Drivers of Health    Financial Resource Strain: Medium Risk (05/12/2023)   Overall Financial Resource Strain (CARDIA)    Difficulty of Paying Living Expenses: Somewhat hard  Food Insecurity: No Food Insecurity (05/12/2023)   Hunger Vital Sign    Worried About Running Out of Food in the Last Year: Never true    Ran Out of Food in the Last Year: Never true  Recent Concern: Food Insecurity - Food Insecurity Present (05/08/2023)   Hunger Vital Sign    Worried About Running Out of Food in the Last Year: Often true    Ran Out of Food in the Last Year: Sometimes true  Transportation Needs: No Transportation Needs (05/12/2023)  PRAPARE - Administrator, Civil Service (Medical): No    Lack of Transportation (Non-Medical): No  Physical Activity: Insufficiently Active (05/12/2023)   Exercise Vital Sign    Days of Exercise per Week: 2 days    Minutes of Exercise per Session: 20 min  Stress: No Stress Concern Present (05/12/2023)   [employer] of Occupational Health - Occupational Stress Questionnaire    Feeling of Stress : Not at all  Social Connections: Moderately Isolated (05/12/2023)   Social Connection and Isolation Panel [NHANES]    Frequency of Communication with Friends and Family: More than three times a week    Frequency of Social Gatherings with Friends and Family: More than three times a week    Attends Religious Services: More than 4 times per year    Active Member of [employer] or Organizations: No    Attends Engineer, structural: Never    Marital Status: Never married    Tobacco Counseling Counseling given: No    Clinical Intake:  Pre-visit preparation completed: Yes  Pain : No/denies pain     BMI - recorded: 28.35 Nutritional Risks: None Diabetes: Yes CBG done?: No Did pt. bring in CBG monitor from home?: No  Lab Results  Component Value Date   HGBA1C 5.7 12/09/2022   HGBA1C 8.4 (H) 03/05/2022   HGBA1C 6.6 (H) 09/11/2021     How often do you need to have  someone help you when you read instructions, pamphlets, or other written materials from your doctor or pharmacy?: 1 - Never  Interpreter Needed?: No  Information entered by :: Hassell Halim, CMA   Activities of Daily Living     05/12/2023   10:44 AM 05/08/2023    1:23 PM  In your present state of health, do you have any difficulty performing the following activities:  Hearing? 0 0  Vision? 0 0  Difficulty concentrating or making decisions? 0 0  Walking or climbing stairs? 0 0  Dressing or bathing? 0 0  Doing errands, shopping? 0 0  Preparing Food and eating ? N N  Using the Toilet? N N  In the past six months, have you accidently leaked urine? N N  Do you have problems with loss of bowel control? N N  Managing your Medications? N N  Managing your Finances? N N  Housekeeping or managing your Housekeeping? N N    Patient Care Team: Myrlene Broker, MD as PCP - General (Internal Medicine) Kathyrn Sheriff, [facility] (Inactive) as Pharmacist (Pharmacist) Frazier, [place], OD (Ophthalmology)  Indicate any recent Medical Services you may have received from other than [facility] providers in the past year (date may be approximate).     Assessment:   This is a routine wellness examination for Hadrian.  Hearing/Vision screen Hearing Screening - Comments:: Denies hearing difficulties   Vision Screening - Comments:: Wears rx glasses - up to date with routine eye exams with Dr Bascom Levels   Goals Addressed               This Visit's Progress     Patient Stated (pt-stated)        Patient stated he plans to exercise and get a join a gym.       Depression Screen     05/12/2023   10:50 AM 05/09/2022   11:16 AM 03/05/2022    3:33 PM 12/05/2021   11:01 AM 05/07/2021    2:42 PM 05/07/2021    2:40 PM 04/25/2020  1:11 PM  PHQ 2/9 Scores  PHQ - 2 Score 0 0 0 0 0 0 4  PHQ- 9 Score 2 3  0       Fall Risk     05/12/2023   10:46 AM 05/08/2023    1:23 PM 12/09/2022    1:13 PM 05/09/2022    11:08 AM 05/02/2022   11:43 PM  Fall Risk   Falls in the past year? 0 1 0 0 0  Number falls in past yr: 0 0 0 0 0  Injury with Fall? 0 0 0 0 0  Risk for fall due to : No Fall Risks   No Fall Risks   Follow up Falls prevention discussed;Falls evaluation completed  Falls evaluation completed Falls prevention discussed     MEDICARE RISK AT HOME:  Medicare Risk at Home Any stairs in or around the home?: No Home free of loose throw rugs in walkways, pet beds, electrical cords, etc?: Yes Adequate lighting in your home to reduce risk of falls?: Yes Life alert?: No Use of a cane, walker or w/c?: No Grab bars in the bathroom?: No Shower chair or bench in shower?: No Elevated toilet seat or a handicapped toilet?: No  TIMED UP AND GO:  Was the test performed?  No  Cognitive Function: 6CIT completed        05/12/2023   10:46 AM 05/09/2022   11:15 AM  6CIT Screen  What Year? 0 points 0 points  What month? 0 points 0 points  What time? 0 points 0 points  Count back from 20 0 points 0 points  Months in reverse 0 points 0 points  Repeat phrase 0 points 0 points  Total Score 0 points 0 points    Immunizations Immunization History  Administered Date(s) Administered   Influenza,inj,Quad PF,6+ Mos 04/24/2018, 10/25/2019, 12/05/2021   Influenza-Unspecified 11/18/2022   Moderna SARS-COV2 Booster Vaccination 01/14/2022   Moderna Sars-Covid-2 Vaccination 06/22/2019, 07/20/2019   Zoster Recombinant(Shingrix) 10/28/2019    Screening Tests Health Maintenance  Topic Date Due   Pneumococcal Vaccine 29-93 Years old (1 of 2 - PCV) Never done   DTaP/Tdap/Td (1 - Tdap) Never done   Zoster Vaccines- Shingrix (2 of 2) 12/23/2019   COVID-19 Vaccine (3 - Moderna risk series) 02/11/2022   HEMOGLOBIN A1C  06/09/2023   Colonoscopy  11/18/2023   Diabetic kidney evaluation - eGFR measurement  12/09/2023   Diabetic kidney evaluation - Urine ACR  12/09/2023   FOOT EXAM  12/09/2023   OPHTHALMOLOGY  EXAM  01/30/2024   Medicare Annual Wellness (AWV)  05/11/2024   INFLUENZA VACCINE  Completed   Hepatitis C Screening  Completed   HIV Screening  Completed   HPV VACCINES  Aged Out    Health Maintenance  Health Maintenance Due  Topic Date Due   Pneumococcal Vaccine 62-61 Years old (1 of 2 - PCV) Never done   DTaP/Tdap/Td (1 - Tdap) Never done   Zoster Vaccines- Shingrix (2 of 2) 12/23/2019   COVID-19 Vaccine (3 - Moderna risk series) 02/11/2022   Health Maintenance Items Addressed:05/12/2023 Educated and advised on the Tdap and Pneumococcal vaccines.  Pt stated will get at local pharmacy. Pt stated has received Shingles vaccine from local pharmacy - asked to obtain the report for PCP (update MyChart).  Additional Screening:  Vision Screening: Recommended annual ophthalmology exams for early detection of glaucoma and other disorders of the eye.  Dental Screening: Recommended annual dental exams for proper oral hygiene  Community  Resource Referral / Chronic Care Management: CRR required this visit?  No   CCM required this visit?  No     Plan:     I have personally reviewed and noted the following in the patient's chart:   Medical and social history Use of alcohol, tobacco or illicit drugs  Current medications and supplements including opioid prescriptions. Patient is not currently taking opioid prescriptions. Functional ability and status Nutritional status Physical activity Advanced directives List of other physicians Hospitalizations, surgeries, and ER visits in previous 12 months Vitals Screenings to include cognitive, depression, and falls Referrals and appointments  In addition, I have reviewed and discussed with patient certain preventive protocols, quality metrics, and best practice recommendations. A written personalized care plan for preventive services as well as general preventive health recommendations were provided to patient.    Darreld Mclean,  CMA   05/12/2023   After Visit Summary: (MyChart) Due to this being a telephonic visit, the after visit summary with patients personalized plan was offered to patient via MyChart   Notes: Nothing significant to report at this time.

## 2023-05-23 ENCOUNTER — Ambulatory Visit: Admitting: Internal Medicine

## 2023-06-10 ENCOUNTER — Encounter: Payer: Self-pay | Admitting: Internal Medicine

## 2023-06-10 ENCOUNTER — Ambulatory Visit (INDEPENDENT_AMBULATORY_CARE_PROVIDER_SITE_OTHER): Admitting: Internal Medicine

## 2023-06-10 VITALS — BP 140/96 | HR 66 | Temp 97.7°F | Ht 72.0 in | Wt 214.0 lb

## 2023-06-10 DIAGNOSIS — R3911 Hesitancy of micturition: Secondary | ICD-10-CM

## 2023-06-10 DIAGNOSIS — N401 Enlarged prostate with lower urinary tract symptoms: Secondary | ICD-10-CM | POA: Diagnosis not present

## 2023-06-10 DIAGNOSIS — E1169 Type 2 diabetes mellitus with other specified complication: Secondary | ICD-10-CM | POA: Diagnosis not present

## 2023-06-10 DIAGNOSIS — Z7984 Long term (current) use of oral hypoglycemic drugs: Secondary | ICD-10-CM | POA: Diagnosis not present

## 2023-06-10 LAB — POCT GLYCOSYLATED HEMOGLOBIN (HGB A1C): HbA1c POC (<> result, manual entry): 6 % (ref 4.0–5.6)

## 2023-06-10 MED ORDER — TAMSULOSIN HCL 0.4 MG PO CAPS: 0.4000 mg | ORAL_CAPSULE | Freq: Every day | ORAL | 1 refills | Status: AC

## 2023-06-10 MED ORDER — GLIMEPIRIDE 2 MG PO TABS: 2.0000 mg | ORAL_TABLET | Freq: Every day | ORAL | 3 refills | Status: AC

## 2023-06-10 NOTE — Progress Notes (Signed)
   Subjective:   Patient ID: Mark Booth, male    DOB: [birth year], 63 y.o.   MRN: [id number]  HPI The patient is a 63 YO man coming in for medical management (see A/P for details).   Review of Systems  Constitutional: Negative.   HENT: Negative.    Eyes: Negative.   Respiratory:  Negative for cough, chest tightness and shortness of breath.   Cardiovascular:  Negative for chest pain, palpitations and leg swelling.  Gastrointestinal:  Negative for abdominal distention, abdominal pain, constipation, diarrhea, nausea and vomiting.  Genitourinary:  Positive for enuresis and frequency.  Musculoskeletal: Negative.   Skin: Negative.   Neurological: Negative.   Psychiatric/Behavioral: Negative.      Objective:  Physical Exam Constitutional:      Appearance: He is well-developed.  HENT:     Head: Normocephalic and atraumatic.  Cardiovascular:     Rate and Rhythm: Normal rate and regular rhythm.  Pulmonary:     Effort: Pulmonary effort is normal. No respiratory distress.     Breath sounds: Normal breath sounds. No wheezing or rales.  Abdominal:     General: Bowel sounds are normal. There is no distension.     Palpations: Abdomen is soft.     Tenderness: There is no abdominal tenderness. There is no rebound.  Musculoskeletal:     Cervical back: Normal range of motion.  Skin:    General: Skin is warm and dry.  Neurological:     Mental Status: He is alert and oriented to person, place, and time.     Coordination: Coordination normal.     Vitals:   06/10/23 1029  BP: (!) 140/96  Pulse: 66  Temp: 97.7 F (36.5 C)  TempSrc: Oral  SpO2: 98%  Weight: 214 lb (97.1 kg)  Height: 6' (1.829 m)    Assessment & Plan:

## 2023-06-10 NOTE — Assessment & Plan Note (Signed)
 Rx flomax  0.4 mg at bedtime to help. He does not want to return to urology.

## 2023-06-10 NOTE — Assessment & Plan Note (Signed)
 POC HgA1c done and 6.0. He would like to stop rybelsus  due to foul taste of medicine. We will d/c and increase amaryl  to 2 mg daily. Follow up 3 months.

## 2023-06-10 NOTE — Patient Instructions (Addendum)
 We will stop rybelsus  and increase glimepiride  to 2 mg daily.   We have sent in tamsulosin  for the bladder to take in the evening.

## 2023-06-21 ENCOUNTER — Other Ambulatory Visit: Payer: Self-pay | Admitting: Internal Medicine

## 2023-06-21 DIAGNOSIS — E039 Hypothyroidism, unspecified: Secondary | ICD-10-CM

## 2023-06-24 ENCOUNTER — Other Ambulatory Visit: Payer: Self-pay | Admitting: Internal Medicine

## 2023-06-24 DIAGNOSIS — E039 Hypothyroidism, unspecified: Secondary | ICD-10-CM

## 2023-06-25 MED ORDER — LEVOTHYROXINE SODIUM 150 MCG PO TABS
150.0000 ug | ORAL_TABLET | Freq: Every day | ORAL | 0 refills | Status: DC
Start: 2023-06-25 — End: 2023-12-15

## 2023-06-25 MED ORDER — NEBIVOLOL HCL 10 MG PO TABS: 10.0000 mg | ORAL_TABLET | Freq: Every day | ORAL | 0 refills | Status: DC

## 2023-07-06 ENCOUNTER — Other Ambulatory Visit: Payer: Self-pay | Admitting: Internal Medicine

## 2023-07-08 NOTE — Telephone Encounter (Signed)
 Patient is needing a call back regarding his medication he is needing the medication refilled

## 2023-07-23 ENCOUNTER — Other Ambulatory Visit: Payer: Self-pay | Admitting: Internal Medicine

## 2023-07-25 ENCOUNTER — Other Ambulatory Visit: Payer: Self-pay | Admitting: Internal Medicine

## 2023-08-05 ENCOUNTER — Other Ambulatory Visit: Payer: Self-pay | Admitting: Internal Medicine

## 2023-08-18 LAB — HM DIABETES EYE EXAM

## 2023-09-03 ENCOUNTER — Other Ambulatory Visit: Payer: Self-pay | Admitting: Medical Genetics

## 2023-09-09 ENCOUNTER — Ambulatory Visit: Admitting: Internal Medicine

## 2023-09-23 ENCOUNTER — Encounter: Payer: Self-pay | Admitting: Internal Medicine

## 2023-09-23 ENCOUNTER — Ambulatory Visit: Admitting: Internal Medicine

## 2023-09-23 VITALS — BP 122/70 | HR 63 | Temp 98.9°F | Ht 72.0 in | Wt 220.0 lb

## 2023-09-23 DIAGNOSIS — R3911 Hesitancy of micturition: Secondary | ICD-10-CM | POA: Diagnosis not present

## 2023-09-23 DIAGNOSIS — F1010 Alcohol abuse, uncomplicated: Secondary | ICD-10-CM

## 2023-09-23 DIAGNOSIS — I10 Essential (primary) hypertension: Secondary | ICD-10-CM

## 2023-09-23 DIAGNOSIS — Z23 Encounter for immunization: Secondary | ICD-10-CM

## 2023-09-23 DIAGNOSIS — E1169 Type 2 diabetes mellitus with other specified complication: Secondary | ICD-10-CM

## 2023-09-23 DIAGNOSIS — N401 Enlarged prostate with lower urinary tract symptoms: Secondary | ICD-10-CM | POA: Diagnosis not present

## 2023-09-23 DIAGNOSIS — K760 Fatty (change of) liver, not elsewhere classified: Secondary | ICD-10-CM

## 2023-09-23 DIAGNOSIS — E785 Hyperlipidemia, unspecified: Secondary | ICD-10-CM

## 2023-09-23 DIAGNOSIS — F419 Anxiety disorder, unspecified: Secondary | ICD-10-CM

## 2023-09-23 DIAGNOSIS — E039 Hypothyroidism, unspecified: Secondary | ICD-10-CM

## 2023-09-23 DIAGNOSIS — E559 Vitamin D deficiency, unspecified: Secondary | ICD-10-CM | POA: Diagnosis not present

## 2023-09-23 DIAGNOSIS — Z7984 Long term (current) use of oral hypoglycemic drugs: Secondary | ICD-10-CM

## 2023-09-23 LAB — COMPREHENSIVE METABOLIC PANEL WITH GFR
ALT: 12 U/L (ref 0–53)
AST: 19 U/L (ref 0–37)
Albumin: 4.3 g/dL (ref 3.5–5.2)
Alkaline Phosphatase: 45 U/L (ref 39–117)
BUN: 30 mg/dL — ABNORMAL HIGH (ref 6–23)
CO2: 29 meq/L (ref 19–32)
Calcium: 9.3 mg/dL (ref 8.4–10.5)
Chloride: 105 meq/L (ref 96–112)
Creatinine, Ser: 0.87 mg/dL (ref 0.40–1.50)
GFR: 93.17 mL/min (ref >60.00)
Glucose, Bld: 139 mg/dL — ABNORMAL HIGH (ref 70–99)
Potassium: 4.3 meq/L (ref 3.5–5.1)
Sodium: 141 meq/L (ref 135–145)
Total Bilirubin: 0.3 mg/dL (ref 0.2–1.2)
Total Protein: 6.8 g/dL (ref 6.0–8.3)

## 2023-09-23 LAB — T4, FREE: Free T4: 0.89 ng/dL (ref 0.60–1.60)

## 2023-09-23 LAB — CBC
HCT: 39.6 % (ref 39.0–52.0)
Hemoglobin: 13.6 g/dL (ref 13.0–17.0)
MCHC: 34.4 g/dL (ref 30.0–36.0)
MCV: 90.5 fl (ref 78.0–100.0)
Platelets: 268 K/uL (ref 150.0–400.0)
RBC: 4.37 Mil/uL (ref 4.22–5.81)
RDW: 13.1 % (ref 11.5–15.5)
WBC: 8.4 K/uL (ref 4.0–10.5)

## 2023-09-23 LAB — LIPID PANEL
Cholesterol: 149 mg/dL (ref 0–200)
HDL: 39.6 mg/dL (ref >39.00)
LDL Cholesterol: 85 mg/dL (ref 0–99)
NonHDL: 108.91
Total CHOL/HDL Ratio: 4
Triglycerides: 118 mg/dL (ref 0.0–149.0)
VLDL: 23.6 mg/dL (ref 0.0–40.0)

## 2023-09-23 LAB — MICROALBUMIN / CREATININE URINE RATIO
Creatinine,U: 81.8 mg/dL
Microalb Creat Ratio: UNDETERMINED mg/g (ref 0.0–30.0)
Microalb, Ur: <0.7 mg/dL

## 2023-09-23 LAB — HEMOGLOBIN A1C: Hgb A1c MFr Bld: 6.1 % (ref 4.6–6.5)

## 2023-09-23 LAB — PSA: PSA: 1.68 ng/mL (ref 0.10–4.00)

## 2023-09-23 LAB — TSH: TSH: 0.53 u[IU]/mL (ref 0.35–5.50)

## 2023-09-23 LAB — VITAMIN B12: Vitamin B-12: 268 pg/mL (ref 211–911)

## 2023-09-23 LAB — VITAMIN D 25 HYDROXY (VIT D DEFICIENCY, FRACTURES): VITD: 28.02 ng/mL — ABNORMAL LOW (ref 30.00–100.00)

## 2023-09-23 NOTE — Progress Notes (Signed)
   Subjective:   Patient ID: Mark Booth, male    DOB: Jul 14, 1960, 63 y.o.   MRN: [id number]  Discussed the use of AI scribe software for clinical note transcription with the patient, who gave verbal consent to proceed.  History of Present Illness Mark Booth is a 63 year old male who presents for routine follow-up and lab work.  He is here for routine follow-up and to have his blood work and labs, including prostate-specific antigen (PSA), checked as it has been a while since his last evaluation.  No new chest pain, tightness, pressure, or breathing troubles. His stomach issues have resolved, and he has no new diarrhea, constipation, or heartburn. His diet and appetite remain unchanged, although he notes difficulty in losing weight despite efforts to exercise more.  He confirms he is not smoking and has mostly given up alcohol. No new joint or muscle pain, headaches, migraines, or vision troubles. He also reports no new skin spots, moles, lumps, or bumps.  He describes symptoms consistent with neuropathy, particularly in the midfoot area, which feels like 'wearing a pair of socks' even when not wearing any. These symptoms are worse at night.  Review of Systems  Constitutional: Negative.   HENT: Negative.    Eyes: Negative.   Respiratory:  Negative for cough, chest tightness and shortness of breath.   Cardiovascular:  Negative for chest pain, palpitations and leg swelling.  Gastrointestinal:  Negative for abdominal distention, abdominal pain, constipation, diarrhea, nausea and vomiting.  Musculoskeletal: Negative.   Skin: Negative.   Neurological: Negative.   Psychiatric/Behavioral: Negative.      Objective:  Physical Exam Constitutional:      Appearance: He is well-developed. He is obese.  HENT:     Head: Normocephalic and atraumatic.  Cardiovascular:     Rate and Rhythm: Normal rate and regular rhythm.  Pulmonary:     Effort: Pulmonary effort is normal. No respiratory  distress.     Breath sounds: Normal breath sounds. No wheezing or rales.  Abdominal:     General: Bowel sounds are normal. There is no distension.     Palpations: Abdomen is soft.     Tenderness: There is no abdominal tenderness. There is no rebound.  Musculoskeletal:     Cervical back: Normal range of motion.  Skin:    General: Skin is warm and dry.  Neurological:     Mental Status: He is alert and oriented to person, place, and time.     Coordination: Coordination normal.     Vitals:   09/23/23 1011  BP: 122/70  Pulse: 63  Temp: 98.9 F (37.2 C)  SpO2: 97%  Weight: 220 lb (99.8 kg)  Height: 6' (1.829 m)    Assessment and Plan Assessment & Plan Type 2 diabetes mellitus with diabetic neuropathy   Diabetic neuropathy symptoms are stable. Vitamin deficiencies, thyroid  dysfunction, and alcohol-related neuropathy were considered as differential diagnoses. Neuropathy resolution is unlikely without correcting specific deficiencies. Monitor vitamin levels, thyroid  function, and blood glucose levels. Advise continued avoidance of alcohol.

## 2023-09-25 NOTE — Assessment & Plan Note (Signed)
Checking TSH and adjust levothyroxine as needed.

## 2023-09-25 NOTE — Assessment & Plan Note (Signed)
 Encouraged continued cessation.

## 2023-09-25 NOTE — Assessment & Plan Note (Signed)
 Checking vitamin D  and adjust as needed.

## 2023-09-25 NOTE — Assessment & Plan Note (Signed)
 Seeing mental health and continue current meds.

## 2023-09-25 NOTE — Assessment & Plan Note (Signed)
 Checking PSA and continue flomax .

## 2023-09-25 NOTE — Assessment & Plan Note (Signed)
Checking lipid panel and adjust as needed simvastatin.

## 2023-09-25 NOTE — Assessment & Plan Note (Signed)
 Checking CMP and adjust as needed.

## 2023-09-25 NOTE — Assessment & Plan Note (Signed)
 Checking HgA1c, UACR, lipid panel and CMP. Adjsut as needed amaryl . On statin and ACE-I.

## 2023-09-25 NOTE — Assessment & Plan Note (Signed)
 BP at goal on current bystolic  and lisinopril /hydrochlorothiazide . Checking CMP and adjust as needed.

## 2023-09-26 ENCOUNTER — Ambulatory Visit: Payer: Self-pay | Admitting: Internal Medicine

## 2023-10-21 ENCOUNTER — Other Ambulatory Visit: Payer: Self-pay | Admitting: Internal Medicine

## 2023-11-28 ENCOUNTER — Other Ambulatory Visit: Payer: Self-pay | Admitting: Internal Medicine

## 2023-12-05 ENCOUNTER — Other Ambulatory Visit: Payer: Self-pay | Admitting: Medical Genetics

## 2023-12-05 DIAGNOSIS — Z006 Encounter for examination for normal comparison and control in clinical research program: Secondary | ICD-10-CM

## 2023-12-13 ENCOUNTER — Other Ambulatory Visit: Payer: Self-pay | Admitting: Internal Medicine

## 2023-12-13 DIAGNOSIS — E039 Hypothyroidism, unspecified: Secondary | ICD-10-CM

## 2024-01-06 ENCOUNTER — Other Ambulatory Visit: Payer: Self-pay | Admitting: Internal Medicine

## 2024-01-27 ENCOUNTER — Other Ambulatory Visit: Payer: Self-pay | Admitting: Internal Medicine

## 2024-03-02 ENCOUNTER — Ambulatory Visit: Admitting: Internal Medicine

## 2024-03-02 ENCOUNTER — Encounter: Payer: Self-pay | Admitting: Internal Medicine

## 2024-03-02 VITALS — BP 130/70 | HR 68 | Temp 98.0°F | Ht 72.0 in | Wt 223.0 lb

## 2024-03-02 DIAGNOSIS — E1169 Type 2 diabetes mellitus with other specified complication: Secondary | ICD-10-CM

## 2024-03-02 DIAGNOSIS — Z7984 Long term (current) use of oral hypoglycemic drugs: Secondary | ICD-10-CM | POA: Diagnosis not present

## 2024-03-02 DIAGNOSIS — M25512 Pain in left shoulder: Secondary | ICD-10-CM

## 2024-03-02 DIAGNOSIS — G8929 Other chronic pain: Secondary | ICD-10-CM

## 2024-03-02 DIAGNOSIS — Z23 Encounter for immunization: Secondary | ICD-10-CM | POA: Diagnosis not present

## 2024-03-02 LAB — POCT GLYCOSYLATED HEMOGLOBIN (HGB A1C): Hemoglobin A1C: 6.1 % — AB (ref 4.0–5.6)

## 2024-03-02 NOTE — Patient Instructions (Signed)
 Make sure you get the colonoscopy done.

## 2024-03-02 NOTE — Progress Notes (Unsigned)
" ° °  Subjective:   Patient ID: Mark Booth, male    DOB: November 14, 1960, 64 y.o.   MRN: [id number]  Discussed the use of AI scribe software for clinical note transcription with the patient, who gave verbal consent to proceed.  History of Present Illness Mark Booth is a 64 year old male with a history of shoulder surgery who presents with left shoulder pain.  He woke up one morning with severe left shoulder pain, similar to the pain experienced prior to his shoulder surgery twelve years ago for two torn tendons. The pain has decreased but persists at a level of 5 out of 10, with limited range of motion and difficulty performing certain movements. No recent trauma or overuse is reported.  He experiences weakness in the left arm, preferring to use his right hand for tasks such as retrieving milk from the refrigerator. He has numbness in the left arm. No new chest pain, tightness, pressure, heart racing, or breathing problems.  He mentions persistent numbness in his toes, which has not worsened. He inquires about the potential link between aging and neuropathy.  He experiences dizziness upon standing and stiffness in the morning, which resolves quickly after moving. No new gastrointestinal symptoms such as diarrhea, constipation, or abdominal pain.  Review of Systems  Constitutional:  Positive for activity change.  HENT: Negative.    Eyes: Negative.   Respiratory:  Negative for cough, chest tightness and shortness of breath.   Cardiovascular:  Negative for chest pain, palpitations and leg swelling.  Gastrointestinal:  Negative for abdominal distention, abdominal pain, constipation, diarrhea, nausea and vomiting.  Musculoskeletal:  Positive for arthralgias and myalgias.  Skin: Negative.   Neurological:  Positive for dizziness.  Psychiatric/Behavioral: Negative.      Objective:  Physical Exam Constitutional:      Appearance: He is well-developed.  HENT:     Head: Normocephalic and  atraumatic.  Cardiovascular:     Rate and Rhythm: Normal rate and regular rhythm.  Pulmonary:     Effort: Pulmonary effort is normal. No respiratory distress.     Breath sounds: Normal breath sounds. No wheezing or rales.  Abdominal:     General: Bowel sounds are normal. There is no distension.     Palpations: Abdomen is soft.     Tenderness: There is no abdominal tenderness.  Musculoskeletal:        General: Tenderness present.     Cervical back: Normal range of motion.  Skin:    General: Skin is warm and dry.  Neurological:     Mental Status: He is alert and oriented to person, place, and time.     Coordination: Coordination normal.     Vitals:   03/02/24 1342  BP: 130/70  Pulse: 68  Temp: 98 F (36.7 C)  TempSrc: Oral  SpO2: 98%  Weight: 223 lb (101.2 kg)  Height: 6' (1.829 m)   Flu shot given at visit  Assessment and Plan Assessment & Plan Left shoulder pain and dysfunction   Chronic pain with limited range of motion and weakness suggests arthritis or a partial tendon tear. Improvement noted. Monitor symptoms for self-resolution. Consider orthopedic referral if no improvement.  Type 2 diabetes mellitus with diabetic neuropathy   Persistent numbness in toes is consistent with diabetic neuropathy. No worsening of symptoms. Continue current management for diabetes and neuropathy. POC HgA1c done today and stable at 6.1 continue current regimen. No low sugars.    "

## 2024-03-03 DIAGNOSIS — M25512 Pain in left shoulder: Secondary | ICD-10-CM | POA: Insufficient documentation

## 2024-03-09 ENCOUNTER — Other Ambulatory Visit: Payer: Self-pay | Admitting: Internal Medicine

## 2024-05-24 ENCOUNTER — Ambulatory Visit

## 2024-05-24 ENCOUNTER — Encounter: Admitting: Internal Medicine
